# Patient Record
Sex: Male | Born: 1985 | Race: White | Hispanic: No | Marital: Married | Smoking: Current every day smoker
Health system: Southern US, Community
[De-identification: ages and names within clinical notes are randomized; demographics above are authoritative.]

## PROBLEM LIST (undated history)

## (undated) DIAGNOSIS — L409 Psoriasis, unspecified: Secondary | ICD-10-CM

## (undated) DIAGNOSIS — J3501 Chronic tonsillitis: Secondary | ICD-10-CM

## (undated) DIAGNOSIS — I10 Essential (primary) hypertension: Secondary | ICD-10-CM

## (undated) DIAGNOSIS — Z720 Tobacco use: Secondary | ICD-10-CM

## (undated) DIAGNOSIS — F17200 Nicotine dependence, unspecified, uncomplicated: Secondary | ICD-10-CM

## (undated) DIAGNOSIS — F1020 Alcohol dependence, uncomplicated: Secondary | ICD-10-CM

## (undated) HISTORY — DX: Psoriasis, unspecified: L40.9

## (undated) HISTORY — DX: Tobacco use: Z72.0

## (undated) HISTORY — DX: Nicotine dependence, unspecified, uncomplicated: F17.200

## (undated) HISTORY — DX: Essential (primary) hypertension: I10

## (undated) HISTORY — DX: Alcohol dependence, uncomplicated: F10.20

---
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
## 2007-02-10 HISTORY — PX: FRACTURE SURGERY: SHX138

## 2007-03-07 ENCOUNTER — Emergency Department (HOSPITAL_COMMUNITY): Admission: EM | Admit: 2007-03-07 | Discharge: 2007-03-08 | Payer: Self-pay | Admitting: Emergency Medicine

## 2007-08-16 ENCOUNTER — Emergency Department (HOSPITAL_COMMUNITY): Admission: EM | Admit: 2007-08-16 | Discharge: 2007-08-16 | Payer: Self-pay | Admitting: Emergency Medicine

## 2007-08-18 ENCOUNTER — Ambulatory Visit (HOSPITAL_COMMUNITY): Admission: RE | Admit: 2007-08-18 | Discharge: 2007-08-18 | Payer: Self-pay | Admitting: Orthopedic Surgery

## 2007-08-18 HISTORY — PX: ORIF FIBULA FRACTURE: SHX2121
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
## 2010-06-27 NOTE — Discharge Summary (Signed)
NAME:  MARTINO, TOMPSON                 ACCOUNT NO.:  [id number]   MEDICAL RECORD NO.:  [id number]           PATIENT TYPE:   LOCATION:                                 FACILITY:   PHYSICIAN:  Clovis Pu. Cornett, M.D.     DATE OF BIRTH:   DATE OF ADMISSION:  DATE OF DISCHARGE:                                 DISCHARGE SUMMARY   Mr. Chevalier is an 25 year old male patient who presented with right lower  quadrant pain for approximately one and a half days.  The pain has worsened  and accompanied by nausea and vomiting.  On examination he elicited focal  right lower quadrant pain, but no guarding or peritonitis.  CT scan showed  enlarged inflamed appendix with a slightly elevated white count at 12,200.   The patient was taken urgently to the operating room under the care of Dr.  Chevis Pretty.  He was diagnosed with appendicitis, an underwent a laparoscopic  appendectomy.  He tolerated the procedure well.  By the following morning he  was ready for discharge home.   DISCHARGE DIAGNOSES:  Acute appendicitis status post laparoscopic  appendectomy as noted above.   Patient has a return visit on January 06, 2005 8:45 a.m.  He was given a  prescription for Vicodin as needed for pain.  No driving for two days.  No  lifting for one week over 10 pounds.  He is to follow up with Dr. Carolynne Edouard again  on January 06, 2005 at 8:45 a.m.      Guy Franco, P.A.      Thomas A. Cornett, M.D.  Electronically Signed    LB/MEDQ  D:  02/03/2005  T:  02/04/2005  Job:  [id number]   cc:   Ollen Gross. Vernell Morgans, M.D.  [address]  [place]  [place] [address]

## 2010-06-27 NOTE — Op Note (Signed)
NAME:  Ralph Tapia, Ralph Tapia                 ACCOUNT NO.:  [id number]   MEDICAL RECORD NO.:  [id number]          PATIENT TYPE:  INP   LOCATION:  5741                         FACILITY:  [facility]   PHYSICIAN:  Ollen Gross. Vernell Morgans, M.D. DATE OF BIRTH:  04/03/1985   DATE OF PROCEDURE:  12/21/2004  DATE OF DISCHARGE:  12/22/2004                                 OPERATIVE REPORT   PREOPERATIVE DIAGNOSIS:  Appendicitis.   POSTOPERATIVE DIAGNOSIS:  Appendicitis.   OPERATION PERFORMED:  Laparoscopic appendectomy.   SURGEON:  Ollen Gross. Carolynne Edouard, M.D.   ANESTHESIA:  General endotracheal.   DESCRIPTION OF PROCEDURE:  After informed consent was obtained, the patient  was brought to the operating room and placed in supine position on the  operating table.  After adequate induction of general anesthesia, the  patient's abdomen was prepped with Betadine and draped in the usual sterile  manner.  The area below the umbilicus was infiltrated with 0.25% Marcaine  and a small incision was made with a 15 blade knife.  This incision was  carried down through the subcutaneous tissue bluntly with a hemostat and  [employer] retractors until the linea alba was identified.  The linea alba  was incised with a 15 blade knife and each side was grasped with Kocher  clamps and elevated anteriorly.  The preperitoneal space was then probed  bluntly with a hemostat until the preperitoneum was opened and access was  gained to the abdominal cavity.  A 0 Vicryl pursestring suture was placed  into the fascia around the opening.  A Hasson cannula was placed through the  opening and anchored in place with a previously placed Vicryl pursestring  stitch.  The abdomen was then insufflated with carbon dioxide without  difficulty.  The patient was placed in Trendelenburg position and rotated  with the right side up.  A laparoscope was inserted through the Hasson  cannula and the right lower quadrant was inspected.  An inflamed appendix  was  readily identified.  Next, the suprapubic area was infiltrated with  0.25% Marcaine.  A small incision was made with a 15 blade knife and a 10 mm  port was placed bluntly through this incision into the abdominal cavity  under direct vision.  A site was chosen above the umbilicus for placement of  a 5 mm port and this area was infiltrated with 0.25% Marcaine.  A small stab  incision was made with a 15 blade knife and a 5 mm port was placed bluntly  through this incision into the abdominal cavity under direct vision.  The  laparoscope was removed to the suprapubic port.  A Glassman retractor was  placed and Harmonic scalpel placed through the upper two ports. The appendix  was grasped with the Glassman grasper and elevated anteriorly.  The appendix  was then mobilized by dissection with the Harmonic scalpel.  The  mesoappendix was taken down sharply with the Harmonic scalpel. There was a  large appendiceal artery which was controlled with two vascular clips.  Once  the base of the appendix was cleared of any  tissue with the Harmonic  scalpel.  Laparoscopic GIA-35 stapler with a blue load was placed through  the Hasson cannula and across the base of the appendix at its junction with  the cecum, clamped, a minute was allowed to pass.  The stapling device was  then fired, thereby dividing the base of the appendix between staple lines.  A laparoscopic bag was then inserted through the Hasson cannula and the  appendix was placed within the bag and the bag was sealed.  The abdomen was  then irrigated with copious amounts of saline.  The staple line was  inspected and found to be completely intact and hemostatic. The appendix and  bag were then removed with the Hasson cannula through the infraumbilical  port without difficulty.  The fascial defect was closed with the previously  placed Vicryl pursestring stitch as well as with another figure-of-eight 0  Vicryl stitch.  The rest of the ports were  removed under direct vision. The  gas was allowed to escape.  The fascia at the suprapubic port was also  closed with an interrupted 0 Vicryl stitch.  Skin incisions were  all closed with interrupted 4-0 Monocryl subcuticular stitch, benzoin and  Steri-Strips were applied.  The patient tolerated the procedure well.  At  the end of the case, all sponge, needle and instrument counts were correct.  The patient was then awakened and taken to recovery room in stable  condition.      Ollen Gross. Vernell Morgans, M.D.  Electronically Signed     PST/MEDQ  D:  12/21/2004  T:  12/22/2004  Job:  [id number]

## 2010-06-27 NOTE — H&P (Signed)
NAME:  MOSES, ODOHERTY                 ACCOUNT NO.:  [id number]   MEDICAL RECORD NO.:  [id number]          PATIENT TYPE:  INP   LOCATION:  1825                         FACILITY:  [facility]   PHYSICIAN:  Ollen Gross. Vernell Morgans, M.D. DATE OF BIRTH:  10-Apr-1985   DATE OF ADMISSION:  12/21/2004  DATE OF DISCHARGE:                                HISTORY & PHYSICAL   Mr. Balaban is an 25 year old white male who presents with right lower  quadrant pain that has been going on for about the last day and a half. The  pain has worsened throughout the day today. He has had some nausea and  vomiting associated with this but no fevers. He is otherwise in good health.  The rest of his review of systems is unremarkable .   PAST MEDICAL HISTORY:  None.   PAST SURGICAL HISTORY:  Significant for wisdom teeth removal.   MEDICATIONS:  None.   ALLERGIES:  No known drug allergies.   SOCIAL HISTORY:  He smokes about a pack of cigarettes a day. Denies any  alcohol use.   FAMILY HISTORY:  Noncontributory.   PHYSICAL EXAMINATION:  VITAL SIGNS:  Temperature is 98.2, blood pressure  113/61, pulse of 58.  GENERAL:  He is a well-developed, well-nourished white male in no acute  distress.  SKIN:  Warm and dry with no jaundice.  HEENT:  His extraocular muscles are intact. Pupils are equal, round, and  reactive to light. Sclerae are nonicteric.  LUNGS:  Clear bilaterally with no use of accessory respiratory muscles.  HEART:  Regular rate and rhythm with an impulse in the left chest.  ABDOMEN:  Soft. Focal right lower quadrant tenderness but no guarding or  peritoneal signs. No palpable mass or hepatosplenomegaly.  EXTREMITIES:  No clubbing, cyanosis, or edema with good strength in his arms  and legs.  NEUROLOGICAL:  Psychologically, he is alert and oriented x3 with no evidence  of anxiety or depression.   On review of his lab work, it was significant for a white count of 12,200.  His CT scan was reviewed with the  radiologist and it showed some enlarged  inflamed appendix with an appendilith.   ASSESSMENT/PLAN:  This is an 25 year old white male with what appears to be  acute appendicitis. Because of the risk of rupture and sepsis, I think he  would benefit from having his appendix removed tonight. I have explained to  him detail the risks and benefits of the operation to remove the appendix as  well as some of the technical aspects. He understands and wish to proceed.  We will plan for this tonight in the operating room.      Ollen Gross. Vernell Morgans, M.D.  Electronically Signed     PST/MEDQ  D:  12/21/2004  T:  12/21/2004  Job:  [id number]

## 2010-10-30 LAB — I-STAT 8, (EC8 V) (CONVERTED LAB)
Acid-base deficit: 3 — ABNORMAL HIGH
BUN: 11
Bicarbonate: 17.5 — ABNORMAL LOW
Chloride: 114 — ABNORMAL HIGH
Glucose, Bld: 95
HCT: 56 — ABNORMAL HIGH
Hemoglobin: 19 — ABNORMAL HIGH
Operator id: 151321
Potassium: 3.6
Sodium: 141
TCO2: 18
pCO2, Ven: 23.9 — ABNORMAL LOW
pH, Ven: 7.473 — ABNORMAL HIGH

## 2010-10-30 LAB — POCT I-STAT CREATININE
Creatinine, Ser: 1
Operator id: 151321

## 2010-10-30 LAB — ETHANOL
Alcohol, Ethyl (B): 154 — ABNORMAL HIGH
Alcohol, Ethyl (B): 220 — ABNORMAL HIGH

## 2010-11-06 LAB — CBC
HCT: 44.2
Hemoglobin: 15.8
MCHC: 35.7
MCV: 98.3
Platelets: 252
RBC: 4.49
RDW: 12.7
WBC: 12.8 — ABNORMAL HIGH

## 2011-02-10 HISTORY — PX: TONSILLECTOMY: SHX5217

## 2011-08-10 DIAGNOSIS — J3501 Chronic tonsillitis: Secondary | ICD-10-CM

## 2011-08-10 HISTORY — DX: Chronic tonsillitis: J35.01

## 2011-08-11 ENCOUNTER — Encounter (HOSPITAL_BASED_OUTPATIENT_CLINIC_OR_DEPARTMENT_OTHER): Payer: Self-pay | Admitting: *Deleted

## 2011-08-17 NOTE — H&P (Signed)
PREOPERATIVE H&P  Chief Complaint: large tonsils  HPI: Ralph Tapia is a 26 y.o. male who presents for evaluation of large tonsils and recurrent sore throats. He 's taken antibiotics for sore throats in the past but continues to have large cryptic tonsils. He's taken to the OR at this time for tonsillectomy.  Past Medical History  Diagnosis Date  . Chronic tonsillitis 08/2011    snores during sleep, occ. stops breathing, and wakes up gasping/choking; has not had sleep study   Past Surgical History  Procedure Date  . Orif fibula fracture 08/18/2007    right distal fib.  . Laparoscopic appendectomy 12/21/2004   History   Social History  . Marital Status: Single    Spouse Name: N/A    Number of Children: N/A  . Years of Education: N/A   Social History Main Topics  . Smoking status: Current Everyday Smoker -- 0.5 packs/day for 3 years    Types: Cigarettes  . Smokeless tobacco: Never Used  . Alcohol Use: Yes     3-4 x/week  . Drug Use: No  . Sexually Active:    Other Topics Concern  . None   Social History Narrative  . None   History reviewed. No pertinent family history. No Known Allergies Prior to Admission medications   Not on File     Positive ROS: sore throat and snoring  All other systems have been reviewed and were otherwise negative with the exception of those mentioned in the HPI and as above.  Physical Exam: There were no vitals filed for this visit.  General: Alert, no acute distress Oral: Normal oral mucosa and 3+ bilateral tonsils Nasal: Clear nasal passages Neck: No palpable adenopathy or thyroid nodules Ear: Ear canal is clear with normal appearing TMs Cardiovascular: Regular rate and rhythm, no murmur.  Respiratory: Clear to auscultation Neurologic: Alert and oriented x 3   Assessment/Plan: chronic tonsillitis Plan for Procedure(s): TONSILLECTOMY   Dillard Cannon, MD 08/17/2011 6:24 PM

## 2011-08-18 ENCOUNTER — Encounter (HOSPITAL_BASED_OUTPATIENT_CLINIC_OR_DEPARTMENT_OTHER): Payer: Self-pay | Admitting: Anesthesiology

## 2011-08-18 ENCOUNTER — Encounter (HOSPITAL_BASED_OUTPATIENT_CLINIC_OR_DEPARTMENT_OTHER)
Admission: RE | Disposition: A | Discharge status: Home / Self Care | Payer: Self-pay | Source: Ambulatory Visit | Attending: Otolaryngology

## 2011-08-18 ENCOUNTER — Ambulatory Visit (HOSPITAL_BASED_OUTPATIENT_CLINIC_OR_DEPARTMENT_OTHER): Payer: BC Managed Care – PPO | Admitting: Anesthesiology

## 2011-08-18 ENCOUNTER — Encounter (HOSPITAL_BASED_OUTPATIENT_CLINIC_OR_DEPARTMENT_OTHER): Payer: Self-pay | Admitting: *Deleted

## 2011-08-18 ENCOUNTER — Ambulatory Visit (HOSPITAL_BASED_OUTPATIENT_CLINIC_OR_DEPARTMENT_OTHER)
Admission: RE | Admit: 2011-08-18 | Discharge: 2011-08-18 | Disposition: A | Discharge status: Home / Self Care | Payer: BC Managed Care – PPO | Source: Ambulatory Visit | Attending: Otolaryngology | Admitting: Otolaryngology

## 2011-08-18 DIAGNOSIS — J3501 Chronic tonsillitis: Secondary | ICD-10-CM | POA: Insufficient documentation

## 2011-08-18 HISTORY — DX: Chronic tonsillitis: J35.01

## 2011-08-18 HISTORY — PX: TONSILLECTOMY: SHX5217

## 2011-08-18 SURGERY — TONSILLECTOMY
Anesthesia: General | Site: Throat | Laterality: Bilateral | Wound class: Clean Contaminated

## 2011-08-18 MED ORDER — PROPOFOL 10 MG/ML IV EMUL
INTRAVENOUS | Status: DC | PRN
  Administered 2011-08-18: 200 mg via INTRAVENOUS

## 2011-08-18 MED ORDER — ACETAMINOPHEN 10 MG/ML IV SOLN
1000.0000 mg | Freq: Once | INTRAVENOUS | Status: AC
  Administered 2011-08-18: 1000 mg via INTRAVENOUS

## 2011-08-18 MED ORDER — HYDROCODONE-ACETAMINOPHEN 7.5-500 MG/15ML PO SOLN: 15.0000 mL | Freq: Four times a day (QID) | ORAL | Status: AC | PRN

## 2011-08-18 MED ORDER — MIDAZOLAM HCL 5 MG/5ML IJ SOLN
INTRAMUSCULAR | Status: DC | PRN
  Administered 2011-08-18: 2 mg via INTRAVENOUS

## 2011-08-18 MED ORDER — LACTATED RINGERS IV SOLN
INTRAVENOUS | Status: DC
  Administered 2011-08-18 (×2): via INTRAVENOUS

## 2011-08-18 MED ORDER — METOCLOPRAMIDE HCL 5 MG/ML IJ SOLN
INTRAMUSCULAR | Status: DC | PRN
  Administered 2011-08-18: 10 mg via INTRAVENOUS

## 2011-08-18 MED ORDER — SCOPOLAMINE 1 MG/3DAYS TD PT72
1.0000 | MEDICATED_PATCH | Freq: Once | TRANSDERMAL | Status: DC
  Administered 2011-08-18: 1.5 mg via TRANSDERMAL

## 2011-08-18 MED ORDER — METOCLOPRAMIDE HCL 5 MG/ML IJ SOLN: 10.0000 mg | Freq: Once | INTRAMUSCULAR | Status: DC | PRN

## 2011-08-18 MED ORDER — SUCCINYLCHOLINE CHLORIDE 20 MG/ML IJ SOLN
INTRAMUSCULAR | Status: DC | PRN
  Administered 2011-08-18: 100 mg via INTRAVENOUS

## 2011-08-18 MED ORDER — AMOXICILLIN 250 MG/5ML PO SUSR: 500.0000 mg | Freq: Two times a day (BID) | ORAL | Status: AC

## 2011-08-18 MED ORDER — ONDANSETRON HCL 4 MG/2ML IJ SOLN
INTRAMUSCULAR | Status: DC | PRN
  Administered 2011-08-18: 4 mg via INTRAVENOUS

## 2011-08-18 MED ORDER — FENTANYL CITRATE 0.05 MG/ML IJ SOLN
INTRAMUSCULAR | Status: DC | PRN
  Administered 2011-08-18 (×2): 50 ug via INTRAVENOUS

## 2011-08-18 MED ORDER — DEXAMETHASONE SODIUM PHOSPHATE 4 MG/ML IJ SOLN
INTRAMUSCULAR | Status: DC | PRN
  Administered 2011-08-18: 10 mg via INTRAVENOUS

## 2011-08-18 MED ORDER — LIDOCAINE HCL (CARDIAC) 20 MG/ML IV SOLN
INTRAVENOUS | Status: DC | PRN
  Administered 2011-08-18: 100 mg via INTRAVENOUS

## 2011-08-18 MED ORDER — HYDROMORPHONE HCL PF 1 MG/ML IJ SOLN
0.2500 mg | INTRAMUSCULAR | Status: DC | PRN
  Administered 2011-08-18: 0.25 mg via INTRAVENOUS
  Administered 2011-08-18: 0.5 mg via INTRAVENOUS
  Administered 2011-08-18: 0.25 mg via INTRAVENOUS

## 2011-08-18 MED ORDER — OXYCODONE HCL 5 MG PO TABS
5.0000 mg | ORAL_TABLET | Freq: Once | ORAL | Status: AC | PRN
  Administered 2011-08-18: 5 mg via ORAL

## 2011-08-18 MED ORDER — CEFAZOLIN SODIUM 1-5 GM-% IV SOLN
INTRAVENOUS | Status: DC | PRN
  Administered 2011-08-18: 1 g via INTRAVENOUS

## 2011-08-18 SURGICAL SUPPLY — 33 items
BANDAGE COBAN STERILE 2 (GAUZE/BANDAGES/DRESSINGS) IMPLANT
CANISTER SUCTION 1200CC (MISCELLANEOUS) ×2 IMPLANT
CATH ROBINSON RED A/P 12FR (CATHETERS) IMPLANT
CATH ROBINSON RED A/P 14FR (CATHETERS) IMPLANT
CLOTH BEACON ORANGE TIMEOUT ST (SAFETY) ×2 IMPLANT
COAGULATOR SUCT SWTCH 10FR 6 (ELECTROSURGICAL) IMPLANT
COVER MAYO STAND STRL (DRAPES) ×2 IMPLANT
ELECT COATED BLADE 2.86 ST (ELECTRODE) ×2 IMPLANT
ELECT REM PT RETURN 9FT ADLT (ELECTROSURGICAL) ×2
ELECT REM PT RETURN 9FT PED (ELECTROSURGICAL)
ELECTRODE REM PT RETRN 9FT PED (ELECTROSURGICAL) IMPLANT
ELECTRODE REM PT RTRN 9FT ADLT (ELECTROSURGICAL) IMPLANT
GAUZE SPONGE 4X4 12PLY STRL LF (GAUZE/BANDAGES/DRESSINGS) ×2 IMPLANT
GLOVE BIO SURGEON STRL SZ 6.5 (GLOVE) ×1 IMPLANT
GLOVE BIOGEL PI IND STRL 7.5 (GLOVE) IMPLANT
GLOVE BIOGEL PI INDICATOR 7.5 (GLOVE) ×1
GLOVE SKINSENSE NS SZ7.5 (GLOVE) ×1
GLOVE SKINSENSE STRL SZ7.5 (GLOVE) IMPLANT
GLOVE SS BIOGEL STRL SZ 7.5 (GLOVE) ×1 IMPLANT
GLOVE SUPERSENSE BIOGEL SZ 7.5 (GLOVE) ×1
GOWN PREVENTION PLUS XLARGE (GOWN DISPOSABLE) IMPLANT
GOWN PREVENTION PLUS XXLARGE (GOWN DISPOSABLE) IMPLANT
MARKER SKIN DUAL TIP RULER LAB (MISCELLANEOUS) IMPLANT
NS IRRIG 1000ML POUR BTL (IV SOLUTION) ×2 IMPLANT
PENCIL FOOT CONTROL (ELECTRODE) ×2 IMPLANT
SHEET MEDIUM DRAPE 40X70 STRL (DRAPES) ×2 IMPLANT
SOLUTION BUTLER CLEAR DIP (MISCELLANEOUS) IMPLANT
SPONGE TONSIL 1 RF SGL (DISPOSABLE) IMPLANT
SPONGE TONSIL 1.25 RF SGL STRG (GAUZE/BANDAGES/DRESSINGS) IMPLANT
SYR BULB 3OZ (MISCELLANEOUS) ×2 IMPLANT
TOWEL OR 17X24 6PK STRL BLUE (TOWEL DISPOSABLE) ×2 IMPLANT
TUBE CONNECTING 20X1/4 (TUBING) ×2 IMPLANT
WATER STERILE IRR 1000ML POUR (IV SOLUTION) ×1 IMPLANT

## 2011-08-18 NOTE — Anesthesia Procedure Notes (Signed)
Procedure Name: Intubation Date/Time: 08/18/2011 7:52 AM Performed by: Zenia Resides D Pre-anesthesia Checklist: Patient identified, Emergency Drugs available, Suction available, Patient being monitored and Timeout performed Patient Re-evaluated:Patient Re-evaluated prior to inductionOxygen Delivery Method: Circle System Utilized Preoxygenation: Pre-oxygenation with 100% oxygen Intubation Type: IV induction Ventilation: Mask ventilation without difficulty Laryngoscope Size: Mac and 3 Grade View: Grade I Tube type: Oral Number of attempts: 1 Airway Equipment and Method: stylet and oral airway Placement Confirmation: ETT inserted through vocal cords under direct vision,  positive ETCO2 and breath sounds checked- equal and bilateral Secured at: 22 cm Tube secured with: Tape Dental Injury: Teeth and Oropharynx as per pre-operative assessment

## 2011-08-18 NOTE — Anesthesia Preprocedure Evaluation (Signed)
Anesthesia Evaluation  Patient identified by MRN, date of birth, ID band Patient awake    Reviewed: Allergy & Precautions, H&P , NPO status , Patient's Chart, lab work & pertinent test results, reviewed documented beta blocker date and time   Airway Mallampati: II TM Distance: >3 FB Neck ROM: full    Dental   Pulmonary Current Smoker,          Cardiovascular negative cardio ROS      Neuro/Psych negative neurological ROS  negative psych ROS   GI/Hepatic negative GI ROS, Neg liver ROS,   Endo/Other  negative endocrine ROS  Renal/GU negative Renal ROS  negative genitourinary   Musculoskeletal   Abdominal   Peds  Hematology negative hematology ROS (+)   Anesthesia Other Findings See surgeon's H&P   Reproductive/Obstetrics negative OB ROS                           Anesthesia Physical Anesthesia Plan  ASA: II  Anesthesia Plan: General   Post-op Pain Management:    Induction: Intravenous  Airway Management Planned: Oral ETT  Additional Equipment:   Intra-op Plan:   Post-operative Plan: Extubation in OR  Informed Consent: I have reviewed the patients History and Physical, chart, labs and discussed the procedure including the risks, benefits and alternatives for the proposed anesthesia with the patient or authorized representative who has indicated his/her understanding and acceptance.   Dental Advisory Given  Plan Discussed with: CRNA and Surgeon  Anesthesia Plan Comments:         Anesthesia Quick Evaluation

## 2011-08-18 NOTE — Brief Op Note (Signed)
08/18/2011  8:10 AM  PATIENT:  Ralph Tapia  26 y.o. male  PRE-OPERATIVE DIAGNOSIS:  chronic tonsillitis  POST-OPERATIVE DIAGNOSIS:  chronic tonsillitis   PROCEDURE:  Procedure(s) (LRB): TONSILLECTOMY (Bilateral)  SURGEON:  Surgeon(s) and Role:    * Drema Halon, MD - Primary  PHYSICIAN ASSISTANT:   ASSISTANTS: none   ANESTHESIA:   general  EBL:  Total I/O In: 800 [I.V.:800] Out: -   BLOOD ADMINISTERED:none  DRAINS: none   LOCAL MEDICATIONS USED:  NONE  SPECIMEN:  No Specimen  DISPOSITION OF SPECIMEN:  N/A  COUNTS:  YES  TOURNIQUET:  * No tourniquets in log *  DICTATION: .Other Dictation: Dictation Number [id number]  PLAN OF CARE: Discharge to home after PACU  PATIENT DISPOSITION:  PACU - hemodynamically stable.   Delay start of Pharmacological VTE agent (>24hrs) due to surgical blood loss or risk of bleeding: not applicable

## 2011-08-18 NOTE — Transfer of Care (Signed)
Immediate Anesthesia Transfer of Care Note  Patient: Ralph Tapia  Procedure(s) Performed: Procedure(s) (LRB): TONSILLECTOMY (Bilateral)  Patient Location: PACU  Anesthesia Type: General  Level of Consciousness: awake, alert  and oriented  Airway & Oxygen Therapy: Patient Spontanous Breathing and Patient connected to face mask oxygen  Post-op Assessment: Report given to PACU RN  Post vital signs: Reviewed and stable  Complications: No apparent anesthesia complications

## 2011-08-18 NOTE — Op Note (Signed)
NAME:  Ralph Tapia, Ralph Tapia                 ACCOUNT NO.:  [id number]  MEDICAL RECORD NO.:  [id number]  LOCATION:                                 FACILITY:  PHYSICIAN:  Kristine Garbe. Ezzard Standing, M.D.DATE OF BIRTH:  Jun 02, 1985  DATE OF PROCEDURE:  08/18/2011 DATE OF DISCHARGE:                              OPERATIVE REPORT   PREOPERATIVE DIAGNOSIS:  Chronic tonsillitis with tonsillar hypertrophy.  POSTOPERATIVE DIAGNOSIS:  Chronic tonsillitis with tonsillar hypertrophy.  OPERATION PERFORMED:  Tonsillectomy.  SURGEON:  Kristine Garbe. Ezzard Standing, M.D.  ANESTHESIA:  General endotracheal.  COMPLICATIONS:  None.  BRIEF CLINICAL NOTE:  Ralph Tapia is a 26 year old gentleman who has had history of tonsillar infections in the past.  The patient had sore throats.  He has always had large tonsils.  DESCRIPTION OF PROCEDURE:  After adequate endotracheal anesthesia, the patient received 1 g Ancef IV preoperatively as well as 10 mg of Decadron.  A mouth gag was used to expose the oropharynx.  The tonsils, which were very large were excised from the tonsillar fossa using cautery.  Care was taken to preserve the tonsillar pillars as well as the uvula.  Hemostasis was obtained with cautery.  After obtaining adequate hemostasis, the oropharynx was irrigated with saline.  Ralph Tapia was awoken from anesthesia and transferred to recovery room postop doing well.  The patient was discharged home later this morning, will follow up in my office in 10 days for recheck.          ______________________________ Kristine Garbe. Ezzard Standing, M.D.     CEN/MEDQ  D:  08/18/2011  T:  08/18/2011  Job:  [id number]  cc:   Windle Guard, M.D.

## 2011-08-18 NOTE — Anesthesia Postprocedure Evaluation (Signed)
Anesthesia Post Note  Patient: Ralph Tapia  Procedure(s) Performed: Procedure(s) (LRB): TONSILLECTOMY (Bilateral)  Anesthesia type: General  Patient location: PACU  Post pain: Pain level controlled  Post assessment: Patient's Cardiovascular Status Stable  Last Vitals:  Filed Vitals:   08/18/11 0935  BP: 122/81  Pulse: 61  Temp: 36.3 C  Resp: 24    Post vital signs: Reviewed and stable  Level of consciousness: alert  Complications: No apparent anesthesia complications

## 2011-08-18 NOTE — Interval H&P Note (Signed)
History and Physical Interval Note:  08/18/2011 7:39 AM  Ralph Tapia  has presented today for surgery, with the diagnosis of chronic tonsillitis  The various methods of treatment have been discussed with the patient and family. After consideration of risks, benefits and other options for treatment, the patient has consented to  Procedure(s) (LRB): TONSILLECTOMY (N/A) as a surgical intervention .  The patient's history has been reviewed, patient examined, no change in status, stable for surgery.  I have reviewed the patients' chart and labs.  Questions were answered to the patient's satisfaction.     NEWMAN, CHRISTOPHER

## 2011-08-20 ENCOUNTER — Encounter (HOSPITAL_BASED_OUTPATIENT_CLINIC_OR_DEPARTMENT_OTHER): Payer: Self-pay | Admitting: Otolaryngology

## 2012-05-25 ENCOUNTER — Ambulatory Visit (INDEPENDENT_AMBULATORY_CARE_PROVIDER_SITE_OTHER): Payer: BC Managed Care – PPO | Admitting: Physician Assistant

## 2012-05-25 ENCOUNTER — Encounter: Payer: Self-pay | Admitting: Physician Assistant

## 2012-05-25 VITALS — BP 152/104 | HR 88 | Temp 98.4°F | Resp 18 | Ht 69.5 in | Wt 156.0 lb

## 2012-05-25 DIAGNOSIS — Z7189 Other specified counseling: Secondary | ICD-10-CM

## 2012-05-25 DIAGNOSIS — F17200 Nicotine dependence, unspecified, uncomplicated: Secondary | ICD-10-CM

## 2012-05-25 DIAGNOSIS — Z716 Tobacco abuse counseling: Secondary | ICD-10-CM

## 2012-05-25 MED ORDER — VARENICLINE TARTRATE 1 MG PO TABS: 1.0000 mg | ORAL_TABLET | Freq: Two times a day (BID) | ORAL | Status: DC

## 2012-05-25 MED ORDER — VARENICLINE TARTRATE 0.5 MG PO TABS: 0.5000 mg | ORAL_TABLET | Freq: Two times a day (BID) | ORAL | Status: DC

## 2012-05-25 NOTE — Progress Notes (Signed)
   Patient ID: Ralph Tapia MRN: [id number], DOB: May 24, 1985, 27 y.o. Date of Encounter: 05/25/2012, 5:19 PM    Chief Complaint:  Chief Complaint  Patient presents with  . skin lesion on leg    discuss stop smoking     HPI: 27 y.o. year old male here to discuss smoking cessation. He has been smoking for 10 years. Currently smokes >1 PPD. Has tried E-Cigarette, which did not help at all. Has used nothing else to try quitting.  Has no significant PMH. No h/o depression/psych hx.      Home Meds: No current outpatient prescriptions on file prior to visit.   No current facility-administered medications on file prior to visit.    Allergies: No Known Allergies    Review of Systems: Since he is a new pt he completed form. All ros negative. Constitutional: negative for chills, fever, night sweats, weight changes, or fatigue  HEENT: negative for vision changes, hearing loss, congestion, rhinorrhea, ST, epistaxis, or sinus pressure Cardiovascular: negative for chest pain or palpitations Respiratory: negative for hemoptysis, wheezing, shortness of breath, or cough Abdominal: negative for abdominal pain, nausea, vomiting, diarrhea, or constipation Dermatological: negative for rash Neurologic: negative for headache, dizziness, or syncope    Physical Exam: Blood pressure 132/84, pulse 88, temperature 98.4 F (36.9 C), temperature source Oral, resp. rate 18, height 5' 9.5" (1.765 m), weight 156 lb (70.761 kg)., Body mass index is 22.71 kg/(m^2). General: Well developed, well nourished,WM in no acute distress. Neck: Supple. No thyromegaly. Full ROM. No lymphadenopathy. Lungs: Clear bilaterally to auscultation without wheezes, rales, or rhonchi. Breathing is unlabored. Heart: RRR . No murmurs, rubs, or gallops. Msk:  Strength and tone normal for age. Extremities/Skin: Warm and dry. No clubbing or cyanosis. No edema. No rashes or suspicious lesions. Neuro: Alert and oriented X 3. Moves all  extremities spontaneously. Gait is normal. CNII-XII grossly in tact. Psych:  Responds to questions appropriately with a normal affect.   ASSESSMENT AND PLAN:  27 y.o. year old male with  1. Smoker 2. Encounter for smoking cessation counseling I gave him a handout with tips for cessation. Discussed options of treatments available to help with cessation. He is agreeable to use Chantix. Discussed possible adverse effects incuding, but not limited to, change in mood and behavior and even suicidal ideation. If any adv effects occur, stop the medication and call us immediately.  - varenicline (CHANTIX) 0.5 MG tablet; Take 1 tablet (0.5 mg total) by mouth 2 (two) times daily.  Dispense: 60 tablet; Refill: 0 - varenicline (CHANTIX CONTINUING MONTH PAK) 1 MG tablet; Take 1 tablet (1 mg total) by mouth 2 (two) times daily.  Dispense: 60 tablet; Refill: 2   Signed, [address] [place], [place], [facility] 05/25/2012 5:19 PM

## 2012-09-09 ENCOUNTER — Telehealth: Payer: Self-pay | Admitting: Physician Assistant

## 2012-09-09 DIAGNOSIS — Z716 Tobacco abuse counseling: Secondary | ICD-10-CM

## 2012-09-09 MED ORDER — VARENICLINE TARTRATE 1 MG PO TABS: 1.0000 mg | ORAL_TABLET | Freq: Two times a day (BID) | ORAL | Status: DC

## 2012-09-09 NOTE — Telephone Encounter (Signed)
One month refilled sent

## 2012-09-22 ENCOUNTER — Encounter: Payer: BC Managed Care – PPO | Admitting: Physician Assistant

## 2012-10-05 ENCOUNTER — Encounter: Payer: Self-pay | Admitting: Physician Assistant

## 2012-10-05 ENCOUNTER — Ambulatory Visit (INDEPENDENT_AMBULATORY_CARE_PROVIDER_SITE_OTHER): Payer: BC Managed Care – PPO | Admitting: Physician Assistant

## 2012-10-05 VITALS — BP 124/78 | HR 80 | Temp 98.2°F | Resp 18 | Ht 70.5 in | Wt 157.0 lb

## 2012-10-05 DIAGNOSIS — F17200 Nicotine dependence, unspecified, uncomplicated: Secondary | ICD-10-CM

## 2012-10-05 DIAGNOSIS — Z0000 Encounter for general adult medical examination without abnormal findings: Secondary | ICD-10-CM

## 2012-10-05 LAB — CBC WITH DIFFERENTIAL/PLATELET
Eosinophils Absolute: 0.1 10*3/uL (ref 0.0–0.7)
Hemoglobin: 15.2 g/dL (ref 13.0–17.0)
Lymphocytes Relative: 37 % (ref 12–46)
Lymphs Abs: 3 10*3/uL (ref 0.7–4.0)
MCH: 33.6 pg (ref 26.0–34.0)
Monocytes Relative: 6 % (ref 3–12)
Neutro Abs: 4.6 10*3/uL (ref 1.7–7.7)
Neutrophils Relative %: 56 % (ref 43–77)
Platelets: 294 10*3/uL (ref 150–400)
RBC: 4.52 MIL/uL (ref 4.22–5.81)
WBC: 8.2 10*3/uL (ref 4.0–10.5)

## 2012-10-05 LAB — COMPLETE METABOLIC PANEL WITH GFR
ALT: 14 U/L (ref 0–53)
Albumin: 4.9 g/dL (ref 3.5–5.2)
CO2: 26 mEq/L (ref 19–32)
Calcium: 9.8 mg/dL (ref 8.4–10.5)
Chloride: 103 mEq/L (ref 96–112)
GFR, Est African American: >89 mL/min
GFR, Est Non African American: >89 mL/min
Glucose, Bld: 88 mg/dL (ref 70–99)
Potassium: 4.6 mEq/L (ref 3.5–5.3)
Sodium: 138 mEq/L (ref 135–145)
Total Bilirubin: 0.9 mg/dL (ref 0.3–1.2)
Total Protein: 6.9 g/dL (ref 6.0–8.3)

## 2012-10-05 LAB — LIPID PANEL
HDL: 55 mg/dL (ref >39)
LDL Cholesterol: 103 mg/dL — ABNORMAL HIGH (ref 0–99)

## 2012-10-05 MED ORDER — BUPROPION HCL ER (SR) 150 MG PO TB12: ORAL_TABLET | ORAL | Status: DC

## 2012-10-05 NOTE — Progress Notes (Signed)
Patient ID: Ralph Tapia MRN: [id number], DOB: 12-20-1985 27 y.o. Date of Encounter: 10/05/2012, 10:37 AM    Chief Complaint: Physical (CPE)  HPI: 27 y.o. y/o male here for CPE.  I have seen him for one office visit prior to today's visit. This was on 05/25/2012. He came to discuss smoking cessation. At that visit I gave him a handout with tips for cessation. As well I gave him prescription for Chantix starting dose and continuing Dosepaks. He says that he got down to where he was smoking only one cigarette per day for about 2-3 weeks. However he then started feeling weird. He just felt that he had mood changes. Says wife was getting upset with him etc. he just didn't feel right he weaned himself off of the Chantix. He is back to smoking as much as in the past. Was over one pack a day. He says his wife does not smoke. He says that smoking at home is not the problem. He says smoking at work is the problem. Whenever he has times of high stress he goes outside and smokes. He says that he realizes this is a habit that really needs to be addressed. As well he says that lots of his coworkers smoke. This makes it difficult. However when he was on the Chantix in the past he was down to 1 cigarette per day.  Otherwise he has no complaints today. He is here to get complete physical exam and complete the form for his insurance.   Review of Systems: Consitutional: No fever, chills, fatigue, night sweats, lymphadenopathy, or weight changes. Eyes: No visual changes, eye redness, or discharge. ENT/Mouth: Ears: No otalgia, tinnitus, hearing loss, discharge. Nose: No congestion, rhinorrhea, sinus pain, or epistaxis. Throat: No sore throat, post nasal drip, or teeth pain. Cardiovascular: No CP, palpitations, diaphoresis, DOE, edema, orthopnea, PND. Respiratory: No cough, hemoptysis, SOB, or wheezing. Gastrointestinal: No anorexia, dysphagia, reflux, pain, nausea, vomiting, hematemesis, diarrhea, constipation,  BRBPR, or melena. Genitourinary: No dysuria, frequency, urgency, hematuria, incontinence, nocturia, decreased urinary stream, discharge, impotence, or testicular pain/masses. Musculoskeletal: No decreased ROM, myalgias, stiffness, joint swelling, or weakness. Skin: No rash, erythema, lesion changes, pain, warmth, jaundice, or pruritis. Neurological: No headache, dizziness, syncope, seizures, tremors, memory loss, coordination problems, or paresthesias. Psychological: No anxiety, depression, hallucinations, SI/HI. Endocrine: No fatigue, polydipsia, polyphagia, polyuria, or known diabetes. All other systems were reviewed and are otherwise negative.  Past Medical History  Diagnosis Date  . Chronic tonsillitis 08/2011    snores during sleep, occ. stops breathing, and wakes up gasping/choking; has not had sleep study  . Smoker      Past Surgical History  Procedure Laterality Date  . Orif fibula fracture  08/18/2007    right distal fib.  . Laparoscopic appendectomy  12/21/2004  . Tonsillectomy  08/18/2011    Procedure: TONSILLECTOMY;  Surgeon: Drema Halon, MD;  Location: [facility];  Service: ENT;  Laterality: Bilateral;  . Tonsillectomy  2013  . Fracture surgery Right 2009    orif    Home Meds: None No current outpatient prescriptions on file prior to visit.   No current facility-administered medications on file prior to visit.    Allergies: No Known Allergies  History   Social History  . Marital Status: married    Spouse Name: N/A    Number of Children: N/A  . Years of Education: N/A   Occupational History  . Works at [employer]    Social History Main Topics  .  Smoking status: Current Every Day Smoker -- 0.50 packs/day for 3 years    Types: Cigarettes  . Smokeless tobacco: Never Used  . Alcohol Use: Yes     Comment: 3-4 x/week  . Drug Use: No  . Sexual Activity: Yes   Other Topics Concern  . Not on file   Social History Narrative   Works as  Production designer, theatre/television/film at [employer]   Married.   No children   No exercise.    History reviewed. No pertinent family history.  Physical Exam: Blood pressure 124/78, pulse 80, temperature 98.2 F (36.8 C), temperature source Oral, resp. rate 18, height 5' 10.5" (1.791 m), weight 157 lb (71.215 kg).  General: Well developed, well nourished, white male.  in no acute distress. HEENT: Normocephalic, atraumatic. Conjunctiva pink, sclera non-icteric. Pupils 2 mm constricting to 1 mm, round, regular, and equally reactive to light and accomodation. EOMI. Internal auditory canal clear. TMs with good cone of light and without pathology. Nasal mucosa pink. Nares are without discharge. No sinus tenderness. Oral mucosa pink.  Pharynx without exudate.   Neck: Supple. Trachea midline. No thyromegaly. Full ROM. No lymphadenopathy. Lungs: Clear to auscultation bilaterally without wheezes, rales, or rhonchi. Breathing is of normal effort and unlabored. Cardiovascular: RRR with S1 S2. No murmurs, rubs, or gallops. Distal pulses 2+ symmetrically. No carotid or abdominal bruits. Abdomen: Soft, non-tender, non-distended with normoactive bowel sounds. No hepatosplenomegaly or masses. No rebound/guarding. No CVA tenderness. No hernias. Musculoskeletal: Full range of motion and 5/5 strength throughout. Without swelling, atrophy, tenderness, crepitus, or warmth. Extremities without clubbing, cyanosis, or edema. Calves supple. Skin: Warm and moist without erythema, ecchymosis, wounds, or rash. Neuro: A+Ox3. CN II-XII grossly intact. Moves all extremities spontaneously. Full sensation throughout. Normal gait. DTR 2+ throughout upper and lower extremities. Finger to nose intact. Psych:  Responds to questions appropriately with a normal affect.   Assessment/Plan:  27 y.o. y/o  male here for CPE -1. Visit for preventive health examination Normal exam. No significant family history. - CBC with Differential - COMPLETE METABOLIC PANEL  WITH GFR - Lipid panel  Immunizations: Immunization required at this age would be a tetanus shot. However he should have had this prior to his surgery he said that he has had recently. This should be up to date so we'll not do this today.  2. Smoker Will use Wellbutrin for smoking cessation. If he does develop adverse effects call me. - buPROPion (WELLBUTRIN SR) 150 MG 12 hr tablet; Take 1 daily for 5 days then increase to  One Twice a Day  Dispense: 60 tablet; Refill: 5   Signed:   [address] [place], [place]  10/05/2012 10:37 AM

## 2012-10-06 ENCOUNTER — Encounter: Payer: Self-pay | Admitting: Family Medicine

## 2013-04-06 ENCOUNTER — Ambulatory Visit: Payer: BC Managed Care – PPO | Admitting: Physician Assistant

## 2013-04-13 ENCOUNTER — Ambulatory Visit (INDEPENDENT_AMBULATORY_CARE_PROVIDER_SITE_OTHER): Payer: BC Managed Care – PPO | Admitting: Physician Assistant

## 2013-04-13 ENCOUNTER — Encounter: Payer: Self-pay | Admitting: Physician Assistant

## 2013-04-13 VITALS — BP 140/82 | HR 80 | Temp 98.2°F | Resp 18 | Wt 154.0 lb

## 2013-04-13 DIAGNOSIS — L408 Other psoriasis: Secondary | ICD-10-CM

## 2013-04-13 DIAGNOSIS — L409 Psoriasis, unspecified: Secondary | ICD-10-CM

## 2013-04-13 NOTE — Progress Notes (Signed)
    Patient ID: Ralph Tapia MRN: [id number], DOB: 1985-08-24, 28 y.o. Date of Encounter: 04/13/2013, 8:18 AM    Chief Complaint:  Chief Complaint  Patient presents with  . strange rash x sev mth    getting worse, itches ?psoriasis     HPI: 28 y.o. year old white male has started developing patches of rash on different parts of his body or the past several months. Says that the areas are extremely itchy. Says that he has been to this and thinks that it is psoriasis. Says he has no family history of psoriasis. Says he has no joint pain.     Home Meds: See attached medication section for any medications that were entered at today's visit. The computer does not put those onto this list.The following list is a list of meds entered prior to today's visit.   Current Outpatient Prescriptions on File Prior to Visit  Medication Sig Dispense Refill  . buPROPion (WELLBUTRIN SR) 150 MG 12 hr tablet Take 1 daily for 5 days then increase to  One Twice a Day  60 tablet  5   No current facility-administered medications on file prior to visit.    Allergies: No Known Allergies    Review of Systems: See HPI for pertinent ROS. All other ROS negative.    Physical Exam: Blood pressure 140/82, pulse 80, temperature 98.2 F (36.8 C), temperature source Oral, resp. rate 18, weight 154 lb (69.854 kg)., Body mass index is 21.78 kg/(m^2). General:  WNWD WM Appears in no acute distress. Lungs: Clear bilaterally to auscultation without wheezes, rales, or rhonchi. Breathing is unlabored. Heart: Regular rhythm. No murmurs, rubs, or gallops. Msk:  Strength and tone normal for age. Skin: Left anterior knee: There is an approximate 1/2 inch diameter area with pink erythema. On top of this is a thick layer of silvery scale. On his scalp on the right side just above the right ear there is an approximate 2 cm diameter area--this area is now covered in scab secondary to excoriation where patient has been  scratching with itching. There are some small lesions on his hand which he says it just started to pop up. They are very small but had a pink base of silvery scale on top.  Says he has similar lesions on his back and scattered on other areas of his body. Neuro: Alert and oriented X 3. Moves all extremities spontaneously. Gait is normal. CNII-XII grossly in tact. Psych:  Responds to questions appropriately with a normal affect.     ASSESSMENT AND PLAN:  28 y.o. year old male with  1. Psoriasis I agree that this does look like psoriasis. Will refer to dermatology for further evaluation and treatment. - Ambulatory referral to Dermatology   Signed, Shon HaleMary Beth Rohnert Park[place]ixon, [place], [facility] 04/13/2013 8:18 AM

## 2013-12-11 ENCOUNTER — Encounter: Payer: Self-pay | Admitting: Physician Assistant

## 2013-12-11 ENCOUNTER — Ambulatory Visit (INDEPENDENT_AMBULATORY_CARE_PROVIDER_SITE_OTHER): Payer: BC Managed Care – PPO | Admitting: Physician Assistant

## 2013-12-11 VITALS — BP 130/82 | HR 98 | Temp 98.3°F | Resp 20 | Ht 72.0 in | Wt 155.0 lb

## 2013-12-11 DIAGNOSIS — L409 Psoriasis, unspecified: Secondary | ICD-10-CM | POA: Diagnosis not present

## 2013-12-11 DIAGNOSIS — Z23 Encounter for immunization: Secondary | ICD-10-CM | POA: Diagnosis not present

## 2013-12-11 DIAGNOSIS — F17200 Nicotine dependence, unspecified, uncomplicated: Secondary | ICD-10-CM

## 2013-12-11 DIAGNOSIS — Z720 Tobacco use: Secondary | ICD-10-CM | POA: Diagnosis not present

## 2013-12-11 HISTORY — DX: Psoriasis, unspecified: L40.9

## 2013-12-11 NOTE — Progress Notes (Signed)
Patient ID: Ralph AxeBrian Tapia MRN: [id number], DOB: 22-Dec-1985, 28 y.o. Date of Encounter: 12/11/2013, 1:10 PM    Chief Complaint:  Chief Complaint  Patient presents with  . Psoriasis    2 yrs, need to be checked for Hep B thought he had as child, battling HTN x 2 yrs     HPI: 28 y.o. year old white male is here for follow-up.  He has had a prior visit with me at which time he was experiencing rash that seemed to be consistent with psoriasis and I did referral to dermatology. He states that he has had multiple visits with dermatology since then. States that they prescribed cream but the rash continued to get worse. Says that they now want to start Enbrel so they did some lab work. Says that the lab work shows that he is not immune to hepatitis B.  Therefore he comes in today to start the hepatitis B immunization series.  He also states that his wife has a blood pressure cuff at home and he has been checking his blood pressure some. He says it's been a while since he checked it and he cannot remember any actual numbers but he was concerned that the readings may have been a little high. Also says that his mother has high blood pressure so this is also why he is concerned that he may be having high blood pressure.  Also reviewed that he has had office visits with me in the past regarding smoking cessation. Says that he is currently smoking about 1 pack per day.  05/2012 office visit gave him a handout with tips for smoking cessation and also prescribed Chantix. With the Chantix he decreased smoking a lot and it almost quit but then he started developing changes in mood and behaviors that he had to stop the medication.  He subsequently restarted smoking.  10/05/2012 I prescribed Wellbutrin. Says that he quit for about 2 or 3 months with the Wellbutrin and he had no adverse effects with the Wellbutrin.  However today he is not wanting me to prescribe Wellbutrin again right now--- says that he  is not ready to quit right now.     Home Meds: None   Allergies: No Known Allergies    Review of Systems: See HPI for pertinent ROS. All other ROS negative.    Physical Exam: Blood pressure 130/82, pulse 98, temperature 98.3 F (36.8 C), temperature source Oral, resp. rate 20, height 6' (1.829 m), weight 155 lb (70.308 kg)., Body mass index is 21.02 kg/(m^2). General: WNWD WM.  Appears in no acute distress. Neck: Supple. No thyromegaly. No lymphadenopathy. Lungs: Clear bilaterally to auscultation without wheezes, rales, or rhonchi. Breathing is unlabored. Heart: Regular rhythm. No murmurs, rubs, or gallops. Msk:  Strength and tone normal for age. Extremities/Skin: Warm and dry. Neuro: Alert and oriented X 3. Moves all extremities spontaneously. Gait is normal. CNII-XII grossly in tact. Psych:  Responds to questions appropriately with a normal affect.     ASSESSMENT AND PLAN:  28 y.o. year old male with  1. Smoker See HPI. He does not want a prescription for medicine to help with cessation right now.  2. Psoriasis Dermatology wants to start Enbrel but he needs to have hepatitis B vaccine. We'll give him the first of the hepatitis B vaccines today and then he will follow-up to complete the series.  3. Need for prophylactic vaccination with combined vaccine - Hepatitis A hepatitis B combined vaccine IM  4.  Concerns of hypertension--I reviewed with him his blood pressure reading today and at prior office visits and the fact that they have been normal. I wrote down the number 140/90 and told him that if he gets readings higher than this then to follow-up. Recommended he thought check his blood pressure about once a month just to make sure that it is staying normal.  Signed, [address] Beth GlenfieldDixon, [place], [facility] 12/11/2013 1:10 PM

## 2014-01-25 ENCOUNTER — Ambulatory Visit (INDEPENDENT_AMBULATORY_CARE_PROVIDER_SITE_OTHER): Payer: BC Managed Care – PPO | Admitting: Family Medicine

## 2014-01-25 DIAGNOSIS — Z23 Encounter for immunization: Secondary | ICD-10-CM

## 2014-06-26 ENCOUNTER — Ambulatory Visit (INDEPENDENT_AMBULATORY_CARE_PROVIDER_SITE_OTHER): Payer: BLUE CROSS/BLUE SHIELD | Admitting: Family Medicine

## 2014-06-26 DIAGNOSIS — Z23 Encounter for immunization: Secondary | ICD-10-CM | POA: Diagnosis not present

## 2014-06-29 ENCOUNTER — Other Ambulatory Visit: Payer: Self-pay | Admitting: Physician Assistant

## 2015-01-21 ENCOUNTER — Encounter: Payer: Self-pay | Admitting: Physician Assistant

## 2015-01-21 ENCOUNTER — Ambulatory Visit (INDEPENDENT_AMBULATORY_CARE_PROVIDER_SITE_OTHER): Payer: BLUE CROSS/BLUE SHIELD | Admitting: Physician Assistant

## 2015-01-21 VITALS — BP 128/82 | HR 80 | Temp 98.5°F | Resp 18 | Wt 156.0 lb

## 2015-01-21 DIAGNOSIS — F1029 Alcohol dependence with unspecified alcohol-induced disorder: Secondary | ICD-10-CM | POA: Diagnosis not present

## 2015-01-21 DIAGNOSIS — F1020 Alcohol dependence, uncomplicated: Secondary | ICD-10-CM | POA: Insufficient documentation

## 2015-01-21 HISTORY — DX: Alcohol dependence, uncomplicated: F10.20

## 2015-01-21 MED ORDER — DIAZEPAM 5 MG PO TABS: 5.0000 mg | ORAL_TABLET | Freq: Two times a day (BID) | ORAL | Status: DC | PRN

## 2015-01-21 NOTE — Progress Notes (Addendum)
Patient ID: Ralph Tapia MRN: [id number], DOB: 03-24-1985, 29 y.o. Date of Encounter: 01/21/2015, 4:47 PM    Chief Complaint:  Chief Complaint  Patient presents with  . wants to discuss problem with alcohol     HPI: 29 y.o. year old white male is here to discuss the above.  Says that he has been drinking alcohol pretty regularly since about age 17. Currently says that on average drinks about 8-10 beers each evening. Drinks no additional type of alcohol as far as any types of liquor etc. Says that when his son was born he tried to quit. His son is now 68 months old. At time he was able to go with no alcohol for 2 days but then restarted. Says that he was not able to sleep and felt shaky. Says that he feels that part of his reason for drinking currently is that his job is very stressful. When he gets home from work, he drinks to unwind. Says that he is going to be off work December 25th through [place] and that this is when he plans to quit. Says that right now work is too stressful to try to quit and have to get up and go to work.  Says that he has looked into options and doesn't feel that he needs a 28 day program or go to the hospital for detox. Says that he has never tried Alcoholics Anonymous but doesn't think the group meetings like that would really be of much help. Says that his wife knows that he was coming here for this visit today and knows he wants to quit and that she is being very supportive and definitely wants him to quit as well.  Discussed whether he feels that he could just gradually decrease the amount of alcohol and limit himself to, for example 4 beers per night, for a while. He says that he does not think this would work for him. He feels that it's more of an "all or none" thing and that once he starts drinking, he will continue. Says that he needs to completely quit and get himself out of this habit.   Did ask about family history--- says that both of his  parents do drink alcohol-- his father more so than his mom.     Home Meds:   Outpatient Prescriptions Prior to Visit  Medication Sig Dispense Refill  . buPROPion (WELLBUTRIN SR) 150 MG 12 hr tablet Take 1 daily for 5 days then increase to  One Twice a Day (Patient not taking: Reported on 01/21/2015) 60 tablet 5   No facility-administered medications prior to visit.    Allergies: No Known Allergies    Review of Systems: See HPI for pertinent ROS. All other ROS negative.    Physical Exam: Blood pressure 128/82, pulse 80, temperature 98.5 F (36.9 C), temperature source Oral, resp. rate 18, weight 156 lb (70.761 kg)., Body mass index is 21.15 kg/(m^2). General: WNWD WM. Appears in no acute distress. Neck: Supple. No thyromegaly. No lymphadenopathy. Lungs: Clear bilaterally to auscultation without wheezes, rales, or rhonchi. Breathing is unlabored. Heart: Regular rhythm. No murmurs, rubs, or gallops. Msk:  Strength and tone normal for age. Extremities/Skin: Warm and dry. Neuro: Alert and oriented X 3. Moves all extremities spontaneously. Gait is normal. CNII-XII grossly in tact. Psych:  Responds to questions appropriately with a normal affect.     ASSESSMENT AND PLAN:  29 y.o. year old male with  1. Alcohol dependence with unspecified alcohol-induced  disorder [facility]) Will try outpatient detox. Will use diazepam to treat withdrawals. Discussed that he can NOT take med with alcohol. He must stop the alcohol, then take the diazepam 1 every 12 hours. He is to schedule f/u OV December 28th to f/u his progress. He is to f/u sooner--either here --- or the ER -- if needed. His wife is aware of this plan and will be present with him during that period of time.  - diazepam (VALIUM) 5 MG tablet; Take 1 tablet (5 mg total) by mouth every 12 (twelve) hours as needed for anxiety.  Dispense: 30 tablet; Refill: 0   Signed, [address] Beth PalmyraDixon, [place], [facility] 01/21/2015 4:47 PM

## 2015-02-07 ENCOUNTER — Ambulatory Visit (INDEPENDENT_AMBULATORY_CARE_PROVIDER_SITE_OTHER): Payer: BLUE CROSS/BLUE SHIELD | Admitting: Physician Assistant

## 2015-02-07 ENCOUNTER — Encounter: Payer: Self-pay | Admitting: Physician Assistant

## 2015-02-07 VITALS — BP 132/88 | HR 84 | Temp 98.2°F | Resp 18 | Wt 154.0 lb

## 2015-02-07 DIAGNOSIS — F1029 Alcohol dependence with unspecified alcohol-induced disorder: Secondary | ICD-10-CM | POA: Diagnosis not present

## 2015-02-07 DIAGNOSIS — Z23 Encounter for immunization: Secondary | ICD-10-CM | POA: Diagnosis not present

## 2015-02-07 MED ORDER — DIAZEPAM 5 MG PO TABS: 5.0000 mg | ORAL_TABLET | Freq: Three times a day (TID) | ORAL | Status: DC | PRN

## 2015-02-07 NOTE — Progress Notes (Signed)
Patient ID: Ralph Tapia MRN: [id number], DOB: 07-03-1985, 29 y.o. Date of Encounter: 02/07/2015, 11:06 AM    Chief Complaint:  Chief Complaint  Patient presents with  . 2 week followup  . Flu Vaccine     HPI: 29 y.o. year old white male is here for 2 week f/u OV.   01/21/2015 OV:  Says that he has been drinking alcohol pretty regularly since about age 29. Currently says that on average drinks about 8-10 beers each evening. Drinks no additional type of alcohol as far as any types of liquor etc. Says that when his son was born he tried to quit. His son is now 4920 months old. At time he was able to go with no alcohol for 2 days but then restarted. Says that he was not able to sleep and felt shaky. Says that he feels that part of his reason for drinking currently is that his job is very stressful. When he gets home from work, he drinks to unwind. Says that he [place] going to [place] off [place] and that this is when he plans to quit. Says that right now work is too stressful to try to quit and have to get up and go to work.  Says that he has looked into options and doesn't feel that he needs a 28 day program or go to the hospital for detox. Says that he has never tried Alcoholics Anonymous but doesn't think the group meetings like that would really be of much help. Says that his wife knows that he was coming here for this visit today and knows he wants to quit and that she is being very supportive and definitely wants him to quit as well.  Discussed whether he feels that he could just gradually decrease the amount of alcohol and limit himself to, for example 4 beers per night, for a while. He says that he does not think this would work for him. He feels that it's more of an "all or none" thing and that once he starts drinking, he will continue. Says that he needs to completely quit and get himself out of this habit.   Did ask about family history--- says that both  of his parents do drink alcohol-- his father more so than his mom.   ASSESSMENT AND PLAN:  29 y.o. year old male with  1. Alcohol dependence with unspecified alcohol-induced disorder (HCC) Will try outpatient detox. Will use diazepam to treat withdrawals. Discussed that he can NOT take med with alcohol. He must stop the alcohol, then take the diazepam 1 every 12 hours. He is to schedule f/u OV December 28th to f/u his progress. He is to f/u sooner--either here --- or the ER -- if needed. His wife is aware of this plan and will be present with him during that period of time.  - diazepam (VALIUM) 5 MG tablet; Take 1 tablet (5 mg total) by mouth every 12 (twelve) hours as needed for anxiety.  Dispense: 30 tablet; Refill: 0   TODAY---02/07/2015:  He states that he has had no alcohol since Sunday 02/03/15. He states that he has been taking the Valium one every morning and then 1 every evening at bedtime. He says that he has had no withdrawal symptoms. Says that he has gotten the best sleep that he has had in a long time. Says that in the past at around 2 AM, for whatever reason, he would wake up. He is  out of work this week through [employer]. Says the main thing right now is trying to keep himself busy so that he doesn't get bored and drink. We discussed that when he returns to work and is stressed at the end of the day-- making plans of what he is going to do when he gets home so that he does not go back to the habit of drinking alcohol when he gets home. He says that in the past his wife and son would go ahead and eat. Then patient would come home from work and he would drink and would wait and eat later. He says that since he's been out of work these past few days, they have been eating dinner together and they plan to continue to do this and he had said this will help create a new habit. He says that his wife is very supportive and is aware that he is doing this and will help him. He says that  actually his dad has decided to also trying to stop drinking alcohol.  Home Meds:   Outpatient Prescriptions Prior to Visit  Medication Sig Dispense Refill  . Adalimumab (HUMIRA [place]) Inject into the skin.    . diazepam (VALIUM) 5 MG tablet Take 1 tablet (5 mg total) by mouth every 12 (twelve) hours as needed for anxiety. 30 tablet 0  . buPROPion (WELLBUTRIN SR) 150 MG 12 hr tablet Take 1 daily for 5 days then increase to  One Twice a Day (Patient not taking: Reported on 01/21/2015) 60 tablet 5   No facility-administered medications prior to visit.    Allergies: No Known Allergies    Review of Systems: See HPI for pertinent ROS. All other ROS negative.    Physical Exam: Blood pressure 132/88, pulse 84, temperature 98.2 F (36.8 C), temperature source Oral, resp. rate 18, weight 154 lb (69.854 kg)., Body mass index is 20.88 kg/(m^2). General: WNWD WM. Appears in no acute distress. Neck: Supple. No thyromegaly. No lymphadenopathy. Lungs: Clear bilaterally to auscultation without wheezes, rales, or rhonchi. Breathing is unlabored. Heart: Regular rhythm. No murmurs, rubs, or gallops. Msk:  Strength and tone normal for age. Extremities/Skin: Warm and dry. Neuro: Alert and oriented X 3. Moves all extremities spontaneously. Gait is normal. CNII-XII grossly in tact. Psych:  Responds to questions appropriately with a normal affect.     ASSESSMENT AND PLAN:  29 y.o. year old male with   1. Alcohol dependence with unspecified alcohol-induced disorder (HCC) While he is out of work this week he will continue to take the Valium twice daily. In he returns to work after [employer] he will not take the Valium in the morning. He will take the Valium in the evening. Hopefully this will help him relax when he gets home from work and relieve his stress without him having to turn to alcohol for stress reduction. Also, as stated in the history of present illness, he is trying to create new habits to  do in the evening hours so that he can continue these new habits once he returns to work. Will have him schedule follow-up office visit in one month. Follow-up sooner if needed. At that follow-up visit will plan to discontinue the Valium. - diazepam (VALIUM) 5 MG tablet; Take 1 tablet (5 mg total) by mouth every 8 (eight) hours as needed for anxiety.  Dispense: 30 tablet; Refill: 0  Need for prophylactic vaccination and inoculation against influenza - Flu Vaccine QUAD 36+ mos IM  Signed, [address] [place], [place], [facility] 02/07/2015 11:06 AM

## 2015-02-20 ENCOUNTER — Other Ambulatory Visit: Payer: Self-pay | Admitting: Physician Assistant

## 2015-02-20 NOTE — Telephone Encounter (Signed)
Change Sig to say " 1 po QHS PRN" # 30 + 0

## 2015-02-20 NOTE — Telephone Encounter (Signed)
LRF 02/07/15 #30  LOV 02/07/15  OK refill?

## 2015-02-21 NOTE — Telephone Encounter (Signed)
Rx called in 

## 2015-03-11 ENCOUNTER — Encounter: Payer: Self-pay | Admitting: Physician Assistant

## 2015-03-11 ENCOUNTER — Ambulatory Visit (INDEPENDENT_AMBULATORY_CARE_PROVIDER_SITE_OTHER): Payer: BLUE CROSS/BLUE SHIELD | Admitting: Physician Assistant

## 2015-03-11 VITALS — BP 130/80 | HR 76 | Temp 98.0°F | Resp 18 | Wt 160.0 lb

## 2015-03-11 DIAGNOSIS — F411 Generalized anxiety disorder: Secondary | ICD-10-CM | POA: Diagnosis not present

## 2015-03-11 MED ORDER — DIAZEPAM 2 MG PO TABS: 2.0000 mg | ORAL_TABLET | Freq: Every evening | ORAL | Status: DC | PRN

## 2015-03-11 NOTE — Progress Notes (Signed)
Patient ID: Ralph Tapia MRN: [id number], DOB: 13-Jan-1986, 30 y.o. Date of Encounter: 03/11/2015, 4:08 PM    Chief Complaint:  Chief Complaint  Patient presents with  . 1 mth follow up     HPI: 30 y.o. year old white male is here for 1 month f/u OV.   01/21/2015 OV:  Says that he has been drinking alcohol pretty regularly since about age 68. Currently says that on average drinks about 8-10 beers each evening. Drinks no additional type of alcohol as far as any types of liquor etc. Says that when his son was born he tried to quit. His son is now 47 months old. At time he was able to go with no alcohol for 2 days but then restarted. Says that he was not able to sleep and felt shaky. Says that he feels that part of his reason for drinking currently is that his job is very stressful. When he gets home from work, he drinks to unwind. Says that he is going to be off work December 25th through [place] and that this is when he plans to quit. Says that right now work is too stressful to try to quit and have to get up and go to work.  Says that he has looked into options and doesn't feel that he needs a 28 day program or go to the hospital for detox. Says that he has never tried Alcoholics Anonymous but doesn't think the group meetings like that would really be of much help. Says that his wife knows that he was coming here for this visit today and knows he wants to quit and that she is being very supportive and definitely wants him to quit as well.  Discussed whether he feels that he could just gradually decrease the amount of alcohol and limit himself to, for example 4 beers per night, for a while. He says that he does not think this would work for him. He feels that it's more of an "all or none" thing and that once he starts drinking, he will continue. Says that he needs to completely quit and get himself out of this habit.   Did ask about family history--- says that both of his parents do  drink alcohol-- his father more so than his mom.   ASSESSMENT AND PLAN:  30 y.o. year old male with  1. Alcohol dependence with unspecified alcohol-induced disorder (HCC) Will try outpatient detox. Will use diazepam to treat withdrawals. Discussed that he can NOT take med with alcohol. He must stop the alcohol, then take the diazepam 1 every 12 hours. He is to schedule f/u OV December 28th to f/u his progress. He is to f/u sooner--either here --- or the ER -- if needed. His wife is aware of this plan and will be present with him during that period of time.  - diazepam (VALIUM) 5 MG tablet; Take 1 tablet (5 mg total) by mouth every 12 (twelve) hours as needed for anxiety.  Dispense: 30 tablet; Refill: 0   OV---02/07/2015:  He states that he has had no alcohol since Sunday 02/03/15. He states that he has been taking the Valium one every morning and then 1 every evening at bedtime. He says that he has had no withdrawal symptoms. Says that he has gotten the best sleep that he has had in a long time. Says that in the past at around 2 AM, for whatever reason, he would wake up. He is out of work  this week through [place]. Says the main thing right now is trying to keep himself busy so that he doesn't get bored and drink. We discussed that when he returns to work and is stressed at the end of the day-- making plans of what he is going to do when he gets home so that he does not go back to the habit of drinking alcohol when he gets home. He says that in the past his wife and son would go ahead and eat. Then patient would come home from work and he would drink and would wait and eat later. He says that since he's been out of work these past few days, they have been eating dinner together and they plan to continue to do this and he had said this will help create a new habit. He says that his wife is very supportive and is aware that he is doing this and will help him. He says that actually his dad has  decided to also trying to stop drinking alcohol.   ASSESSMENT AND PLAN:  30 y.o. year old male with   1. Alcohol dependence with unspecified alcohol-induced disorder (HCC) While he is out of work this week he will continue to take the Valium twice daily. In he returns to work after [employer] he will not take the Valium in the morning. He will take the Valium in the evening. Hopefully this will help him relax when he gets home from work and relieve his stress without him having to turn to alcohol for stress reduction. Also, as stated in the history of present illness, he is trying to create new habits to do in the evening hours so that he can continue these new habits once he returns to work. Will have him schedule follow-up office visit in one month. Follow-up sooner if needed. At that follow-up visit will plan to discontinue the Valium. - diazepam (VALIUM) 5 MG tablet; Take 1 tablet (5 mg total) by mouth every 8 (eight) hours as needed for anxiety.  Dispense: 30 tablet; Refill: 0   03/11/2015: Patient states that he has stayed off of the alcohol. Says it has been 36 days.  Says that his father also quit and has been off alcohol 36 days. Says he has been taking the Valium each night. Says that he and his wife and child have been able to continue the habit of eating dinner together when he gets home from work and has created new habit for him. Says so far, so good-- he is feeling good about it so far.    Home Meds:   Outpatient Prescriptions Prior to Visit  Medication Sig Dispense Refill  . Adalimumab (HUMIRA [place]) Inject into the skin.    . diazepam (VALIUM) 5 MG tablet Take 1 tablet (5 mg total) by mouth at bedtime as needed. for anxiety 30 tablet 0  . buPROPion (WELLBUTRIN SR) 150 MG 12 hr tablet Take 1 daily for 5 days then increase to  One Twice a Day (Patient not taking: Reported on 01/21/2015) 60 tablet 5   No facility-administered medications prior to visit.    Allergies: No  Known Allergies    Review of Systems: See HPI for pertinent ROS. All other ROS negative.    Physical Exam: Blood pressure 130/80, pulse 76, temperature 98 F (36.7 C), temperature source Oral, resp. rate 18, weight 160 lb (72.576 kg)., Body mass index is 21.7 kg/(m^2). General: WNWD WM. Appears in no acute distress. Neck: Supple.  No thyromegaly. No lymphadenopathy. Lungs: Clear bilaterally to auscultation without wheezes, rales, or rhonchi. Breathing is unlabored. Heart: Regular rhythm. No murmurs, rubs, or gallops. Msk:  Strength and tone normal for age. Extremities/Skin: Warm and dry. Neuro: Alert and oriented X 3. Moves all extremities spontaneously. Gait is normal. CNII-XII grossly in tact. Psych:  Responds to questions appropriately with a normal affect. Very appropriate affect, very appropriate behavior.      ASSESSMENT AND PLAN:  30 y.o. year old male with   Alcohol dependence with unspecified alcohol-induced disorder (HCC) Anxiety state Will decrease the dose of Valium down to 2 mg. Will have him take 2 mg dose each evening for several weeks then he can try going without it. Will not have him schedule follow-up office visit at this time. He can see how things go and follow-up PRN.  If he feels like he might slip back to consuming alcohol or feels that he needs Valium to help him relax rather than going back to alcohol, then he will schedule follow-up office visit. Otherwise, f/u PRN and Congrats on cessation of alcohol!! - diazepam (VALIUM) 2 MG tablet; Take 1 tablet (2 mg total) by mouth at bedtime as needed for anxiety.  Dispense: 30 tablet; Refill: 0  Signed, [address] [place], [place], [facility] 03/11/2015 4:08 PM

## 2015-04-22 ENCOUNTER — Other Ambulatory Visit: Payer: BLUE CROSS/BLUE SHIELD

## 2015-04-22 DIAGNOSIS — Z205 Contact with and (suspected) exposure to viral hepatitis: Secondary | ICD-10-CM

## 2015-04-22 LAB — HEPATIC FUNCTION PANEL
ALBUMIN: 4.3 g/dL (ref 3.6–5.1)
ALK PHOS: 48 U/L (ref 40–115)
ALT: 23 U/L (ref 9–46)
AST: 16 U/L (ref 10–40)
Bilirubin, Direct: 0.1 mg/dL (ref <=0.2)
Indirect Bilirubin: 0.5 mg/dL (ref 0.2–1.2)
TOTAL PROTEIN: 6.4 g/dL (ref 6.1–8.1)
Total Bilirubin: 0.6 mg/dL (ref 0.2–1.2)

## 2015-04-23 LAB — HEPATITIS PANEL, ACUTE
HCV Ab: NEGATIVE
HEP B S AG: NEGATIVE
Hep A IgM: NONREACTIVE
Hep B C IgM: NONREACTIVE

## 2015-09-15 ENCOUNTER — Other Ambulatory Visit: Payer: Self-pay | Admitting: Physician Assistant

## 2015-09-16 ENCOUNTER — Other Ambulatory Visit: Payer: Self-pay | Admitting: Physician Assistant

## 2015-09-20 DIAGNOSIS — D225 Melanocytic nevi of trunk: Secondary | ICD-10-CM | POA: Diagnosis not present

## 2015-09-20 DIAGNOSIS — L400 Psoriasis vulgaris: Secondary | ICD-10-CM | POA: Diagnosis not present

## 2015-10-02 DIAGNOSIS — Z79899 Other long term (current) drug therapy: Secondary | ICD-10-CM | POA: Diagnosis not present

## 2015-10-02 DIAGNOSIS — L409 Psoriasis, unspecified: Secondary | ICD-10-CM | POA: Diagnosis not present

## 2015-10-15 ENCOUNTER — Other Ambulatory Visit: Payer: Self-pay | Admitting: Physician Assistant

## 2015-10-15 NOTE — Telephone Encounter (Signed)
Ok to refill??  Last office visit 03/11/2015.  Last refill 09/16/2015.

## 2015-10-16 NOTE — Telephone Encounter (Signed)
Have him schedule a f/u OV.

## 2015-10-17 NOTE — Telephone Encounter (Signed)
Called  Pt to sch appt. Pt  req refill on Valium. Per provider appointment needs to be made

## 2016-03-24 DIAGNOSIS — L400 Psoriasis vulgaris: Secondary | ICD-10-CM | POA: Diagnosis not present

## 2016-09-22 DIAGNOSIS — Z79899 Other long term (current) drug therapy: Secondary | ICD-10-CM | POA: Diagnosis not present

## 2016-09-22 DIAGNOSIS — L400 Psoriasis vulgaris: Secondary | ICD-10-CM | POA: Diagnosis not present

## 2017-04-27 DIAGNOSIS — Z79899 Other long term (current) drug therapy: Secondary | ICD-10-CM | POA: Diagnosis not present

## 2017-04-27 DIAGNOSIS — L400 Psoriasis vulgaris: Secondary | ICD-10-CM | POA: Diagnosis not present

## 2017-06-16 ENCOUNTER — Other Ambulatory Visit: Payer: Self-pay

## 2017-06-16 ENCOUNTER — Encounter: Payer: Self-pay | Admitting: Physician Assistant

## 2017-06-16 ENCOUNTER — Ambulatory Visit (INDEPENDENT_AMBULATORY_CARE_PROVIDER_SITE_OTHER): Payer: BLUE CROSS/BLUE SHIELD | Admitting: Physician Assistant

## 2017-06-16 VITALS — BP 138/98 | HR 93 | Temp 98.0°F | Resp 18 | Ht 71.5 in | Wt 162.0 lb

## 2017-06-16 DIAGNOSIS — F1020 Alcohol dependence, uncomplicated: Secondary | ICD-10-CM | POA: Diagnosis not present

## 2017-06-16 DIAGNOSIS — F17200 Nicotine dependence, unspecified, uncomplicated: Secondary | ICD-10-CM

## 2017-06-16 DIAGNOSIS — Z23 Encounter for immunization: Secondary | ICD-10-CM

## 2017-06-16 DIAGNOSIS — Z0000 Encounter for general adult medical examination without abnormal findings: Secondary | ICD-10-CM | POA: Diagnosis not present

## 2017-06-16 DIAGNOSIS — L409 Psoriasis, unspecified: Secondary | ICD-10-CM

## 2017-06-16 DIAGNOSIS — I10 Essential (primary) hypertension: Secondary | ICD-10-CM

## 2017-06-16 HISTORY — DX: Essential (primary) hypertension: I10

## 2017-06-16 LAB — COMPLETE METABOLIC PANEL WITH GFR
AG RATIO: 1.8 (calc) (ref 1.0–2.5)
ALT: 19 U/L (ref 9–46)
AST: 21 U/L (ref 10–40)
Albumin: 4.6 g/dL (ref 3.6–5.1)
Alkaline phosphatase (APISO): 62 U/L (ref 40–115)
BUN: 16 mg/dL (ref 7–25)
CALCIUM: 9.7 mg/dL (ref 8.6–10.3)
CO2: 25 mmol/L (ref 20–32)
Chloride: 104 mmol/L (ref 98–110)
Creat: 0.96 mg/dL (ref 0.60–1.35)
GFR, EST AFRICAN AMERICAN: 122 mL/min/{1.73_m2} (ref >=60)
GFR, EST NON AFRICAN AMERICAN: 105 mL/min/{1.73_m2} (ref >=60)
GLUCOSE: 80 mg/dL (ref 65–99)
Globulin: 2.5 g/dL (calc) (ref 1.9–3.7)
POTASSIUM: 4.7 mmol/L (ref 3.5–5.3)
Sodium: 140 mmol/L (ref 135–146)
TOTAL PROTEIN: 7.1 g/dL (ref 6.1–8.1)
Total Bilirubin: 0.7 mg/dL (ref 0.2–1.2)

## 2017-06-16 LAB — CBC WITH DIFFERENTIAL/PLATELET
BASOS ABS: 29 {cells}/uL (ref 0–200)
Basophils Relative: 0.4 %
EOS ABS: 183 {cells}/uL (ref 15–500)
EOS PCT: 2.5 %
HEMATOCRIT: 45.1 % (ref 38.5–50.0)
HEMOGLOBIN: 16.2 g/dL (ref 13.2–17.1)
LYMPHS ABS: 2840 {cells}/uL (ref 850–3900)
MCH: 34.1 pg — AB (ref 27.0–33.0)
MCHC: 35.9 g/dL (ref 32.0–36.0)
MCV: 94.9 fL (ref 80.0–100.0)
MPV: 10.6 fL (ref 7.5–12.5)
Monocytes Relative: 8.1 %
NEUTROS ABS: 3657 {cells}/uL (ref 1500–7800)
Neutrophils Relative %: 50.1 %
Platelets: 272 10*3/uL (ref 140–400)
RBC: 4.75 10*6/uL (ref 4.20–5.80)
RDW: 12.6 % (ref 11.0–15.0)
Total Lymphocyte: 38.9 %
WBC mixed population: 591 cells/uL (ref 200–950)
WBC: 7.3 10*3/uL (ref 3.8–10.8)

## 2017-06-16 LAB — LIPID PANEL
Cholesterol: 201 mg/dL — ABNORMAL HIGH (ref <200)
HDL: 58 mg/dL (ref >40)
LDL CHOLESTEROL (CALC): 130 mg/dL — AB
NON-HDL CHOLESTEROL (CALC): 143 mg/dL — AB (ref <130)
TRIGLYCERIDES: 44 mg/dL (ref <150)
Total CHOL/HDL Ratio: 3.5 (calc) (ref <5.0)

## 2017-06-16 LAB — TSH: TSH: 0.5 mIU/L (ref 0.40–4.50)

## 2017-06-16 MED ORDER — AMLODIPINE BESYLATE 5 MG PO TABS: 5.0000 mg | ORAL_TABLET | Freq: Every day | ORAL | 11 refills | Status: DC

## 2017-06-16 NOTE — Progress Notes (Signed)
Patient ID: Ralph Tapia MRN: [id number], DOB: 01-12-1986 32 y.o. Date of Encounter: 06/16/2017, 9:07 AM    Chief Complaint: Physical (CPE)  HPI: 32 y.o. y/o male here for CPE.    Today I reviewed his prior visit notes from his prior visits with me.  He had visit with me 05/25/2012 to discuss smoking cessation.  At that visit I gave a handout with tips for cessation and also prescription for Chantix.  He had visit 10/05/2012 for CPE. At that visit he reported that he had taken the Chantix and he gotten to where he was smoking only 1 cigarette/day for about 2 to 3 weeks.   However he then started feeling weird and felt that he was having mood changes.   Weaned himself off of the Chantix.   At the time of that visit 09/2012 he was back to smoking as much as he had been in the past and was smoking over a pack a day.   Reported that his wife does not smoke.   Reported that a lot of coworkers smoked and he was smoking a lot at work.  At that visit 10/05/2012 I prescribed Wellbutrin to help with smoking cessation.  He also had visit with me regarding a rash that seem to be consistent with psoriasis and I had referred to dermatology.   He then had visit with me on 12/11/2013. At that visit he reported that he had had multiple visits with dermatology since then.  Reported that they had prescribed a cream but the rash continued to get worse.  At visit 12/11/2013 stated that they wanted to start Enbrel so they did some lab work.  Stated that the lab work showed that he was not immune to hepatitis B so came here to start hepatitis B unionization series 12/11/2013.  At his visit with me 12/11/2013 he also reported that his wife had a blood pressure cuff at home and have been checking his blood pressure some.  Says that those readings have been a little high.  At the time of the visit 12/11/2013 reported smoking about 1 pack/day. Reviewed that I prescribed the Chantix at 05/2012 visit.  That had caused  changes in mood and behaviors that he had stopped that medication.  Subsequently restarted smoking.  Reviewed that at visit 10/05/2012 I prescribed Wellbutrin.  At the follow-up visit 12/11/2013 he reported that he had quit smoking for about 2 or 3 months with the Wellbutrin and had no adverse effects with the Wellbutrin.  However at his visit 12/11/2013 stated that he was not wanting me to prescribe the Wellbutrin again.  Said that he was not ready to quit right now ".  At that visit to review that he was smoking but did not want any medication to help with cessation at that time. At that visit started hepatitis B vaccines so that he could start Enbrel with dermatology. At that visit his blood pressure reading was good and reviewed that his blood pressure readings had been good here at prior visits.   He was to continue monitoring BP and follow-up if getting greater than 140/90.  He then had visit with me 01/21/2015. Reported that he had been drinking alcohol regularly since he was about age 4.   At the time of his visit 01/2015 said that he had been drinking about 8-10 beers each evening.   Was drinking no additional type of alcohol as far as liquor etc.   At his visit 02/2015 he  was trying to stop his alcohol and was trying outpatient detox.  Had follow-up visit with me 02/07/2015.  See that note for detail.  That time he had decreased his alcohol consumption.  Subsequently had follow-up visit with me 03/11/2015.  At that point he has stayed off of alcohol for 36 days.   TODAY----06/16/2017: Today he is here for complete physical exam. He has no specific concerns to address--- except that his wife wants him to see about getting a booster for the MMR vaccine-- given the recent measles outbreak. Today he states that he still just has 1 child--- a son.  States that he turned 32 years old yesterday. I asked about his smoking and how much he is currently smoking.  Currently smoking about 1  pack/day. Also asked about his alcohol.  States that he is drinking some alcohol on a regular basis but "not nearly as much as it was in the past". Today I also noted that his blood pressure is a little high today.  I asked if he is wife or anyone has been checking his blood pressure at all recently.  He reports that he has not had his blood pressure checked at all in the past year. Also asked about his psoriasis.  States that he is on Humira and injects himself every 2 weeks. He is fasting to check labs today. We have no Tdap documented and he is not aware of having this in the past 10 years.  He is agreeable to update Tdap today as well as get an MMR booster. I rechecked his blood pressure myself and getting around 140/100, 138/98.  He is agreeable to go ahead and start Norvasc 5 mg daily and return for follow-up blood pressure check in 2 weeks. Reviewed family history again today and there is no premature family history of CAD or CA.     Review of Systems: Consitutional: No fever, chills, fatigue, night sweats, lymphadenopathy, or weight changes. Eyes: No visual changes, eye redness, or discharge. ENT/Mouth: Ears: No otalgia, tinnitus, hearing loss, discharge. Nose: No congestion, rhinorrhea, sinus pain, or epistaxis. Throat: No sore throat, post nasal drip, or teeth pain. Cardiovascular: No CP, palpitations, diaphoresis, DOE, edema, orthopnea, PND. Respiratory: No cough, hemoptysis, SOB, or wheezing. Gastrointestinal: No anorexia, dysphagia, reflux, pain, nausea, vomiting, hematemesis, diarrhea, constipation, BRBPR, or melena. Genitourinary: No dysuria, frequency, urgency, hematuria, incontinence, nocturia, decreased urinary stream, discharge, impotence, or testicular pain/masses. Musculoskeletal: No decreased ROM, myalgias, stiffness, joint swelling, or weakness. Skin: He has psoriasis.  Managed by dermatology. Neurological: No headache, dizziness, syncope, seizures, tremors, memory loss,  coordination problems, or paresthesias. Psychological: No anxiety, depression, hallucinations, SI/HI. Endocrine: No fatigue, polydipsia, polyphagia, polyuria, or known diabetes. All other systems were reviewed and are otherwise negative.  Past Medical History:  Diagnosis Date  . Chronic tonsillitis 08/2011   snores during sleep, occ. stops breathing, and wakes up gasping/choking; has not had sleep study  . Smoker      Past Surgical History:  Procedure Laterality Date  . FRACTURE SURGERY Right 2009   orif  . LAPAROSCOPIC APPENDECTOMY  12/21/2004  . ORIF FIBULA FRACTURE  08/18/2007   right distal fib.  . TONSILLECTOMY  08/18/2011   Procedure: TONSILLECTOMY;  Surgeon: Rozetta Nunnery, MD;  Location: [facility];  Service: ENT;  Laterality: Bilateral;  . TONSILLECTOMY  2013    Home Meds:  Outpatient Medications Prior to Visit  Medication Sig Dispense Refill  . Adalimumab (HUMIRA [place]) Inject into the skin.    Marland Kitchen  buPROPion (WELLBUTRIN SR) 150 MG 12 hr tablet Take 1 daily for 5 days then increase to  One Twice a Day 60 tablet 5  . diazepam (VALIUM) 2 MG tablet Take 1 tablet (2 mg total) by mouth at bedtime as needed for anxiety. 30 tablet 0  . diazepam (VALIUM) 5 MG tablet TAKE 1 TABLET BY MOUTH AT BEDTIME AS NEEDED 30 tablet 0   No facility-administered medications prior to visit.     Allergies: No Known Allergies  Social History   Socioeconomic History  . Marital status: Single    Spouse name: Not on file  . Number of children: Not on file  . Years of education: Not on file  . Highest education level: Not on file  Occupational History  . Occupation: Works at [employer]  . Financial resource strain: Not on file  . Food insecurity:    Worry: Not on file    Inability: Not on file  . Transportation needs:    Medical: Not on file    Non-medical: Not on file  Tobacco Use  . Smoking status: Current Every Day Smoker     Packs/day: 0.50    Years: 3.00    Pack years: 1.50    Types: Cigarettes  . Smokeless tobacco: Never Used  Substance and Sexual Activity  . Alcohol use: Yes    Comment: 3-4 x/week  . Drug use: No  . Sexual activity: Yes  Lifestyle  . Physical activity:    Days per week: Not on file    Minutes per session: Not on file  . Stress: Not on file  Relationships  . Social connections:    Talks on phone: Not on file    Gets together: Not on file    Attends religious service: Not on file    Active member of club or organization: Not on file    Attends meetings of clubs or organizations: Not on file    Relationship status: Not on file  . Intimate partner violence:    Fear of current or ex partner: Not on file    Emotionally abused: Not on file    Physically abused: Not on file    Forced sexual activity: Not on file  Other Topics Concern  . Not on file  Social History Narrative   Works as Freight forwarder at [employer]   Married.   No children   No exercise.    History reviewed. No pertinent family history.   Physical Exam: Blood pressure (!) 138/98, pulse 93, temperature 98 F (36.7 C), temperature source Oral, resp. rate 18, height 5' 11.5" (1.816 m), weight 73.5 kg (162 lb), SpO2 97 %., Body mass index is 22.28 kg/m. General: WNWD WM. Appears in no acute distress. Head: Normocephalic, atraumatic, eyes without discharge, sclera non-icteric, nares are without discharge. Bilateral auditory canals clear, TM's are without perforation, pearly grey and translucent with reflective cone of light bilaterally. Oral cavity moist, posterior pharynx without exudate, erythema, peritonsillar abscess.  Neck: Supple. No thyromegaly. No lymphadenopathy. Lungs: Clear bilaterally to auscultation without wheezes, rales, or rhonchi. Breathing is unlabored. Heart: RRR with S1 S2. No murmurs, rubs, or gallops. Abdomen: Soft, non-tender, non-distended with normoactive bowel sounds. No hepatomegaly. No  rebound/guarding. No obvious abdominal masses. Musculoskeletal:  Strength and tone normal for age. Extremities/Skin: Warm and dry. No clubbing or cyanosis. No edema. Neuro: Alert and oriented X 3. Moves all extremities spontaneously. Gait is normal. CNII-XII  grossly in tact. Psych:  Responds to questions appropriately with a normal affect.   Assessment/Plan:  32 y.o. y/o  male here for CPE  -1. Encounter for preventive health examination A. 06/16/2017: Screening Labs: - CBC with Differential/Platelet - COMPLETE METABOLIC PANEL WITH GFR - Lipid panel - TSH   B. Screening For Prostate Cancer: He has no indication to require this until age 39  C. Screening For Colorectal Cancer:  He has no indication to require this until age 65  D. Immunizations: Flu----------------------------N/A Tetanus---------- We have no tetanus vaccine documented in his chart.  He reports he does not think he has had this in over 10 years.  Agreeable to update this today.Tdap given here 06/16/2017 Pneumococcal--- he is a smoker so will need to get Pneumovax 23--- give this at next visit Shingrix-------------- not indicated until age 75.    2. Smoker 06/16/2017: See HPI.  I have prescribed Chantix in the past.  At another time later also prescribed Wellbutrin in the past.  Since then at any at all follow-ups since then he has not wanted any medication to help with cessation and "is not ready to quit ".  3. Uncomplicated alcohol dependence ([place]) 06/16/2017: See HPI for detail.  Has a history of significant alcohol use.  Today reports that he is drinking some alcohol but what much smaller amount.  Will check LFTs. - COMPLETE METABOLIC PANEL WITH GFR  4. Psoriasis 06/16/2017: This is managed by dermatology.  5. Essential hypertension 06/16/2017: His blood pressure is elevated today.  Will have him start Norvasc 5 mg daily and return for follow-up visit in 2 weeks to recheck BP on medication. ------------- also noted  that he had prior visit with me regarding his wife getting high blood pressure readings at home.  Therefore given his history will go ahead and start medication today rather than waiting to --------------monitor further. - amLODipine (NORVASC) 5 MG tablet; Take 1 tablet (5 mg total) by mouth daily.  Dispense: 30 tablet; Refill: 11    Signed:   [address] [place], [place]  06/16/2017 9:07 AM

## 2017-06-20 ENCOUNTER — Encounter (HOSPITAL_COMMUNITY): Payer: Self-pay | Admitting: Emergency Medicine

## 2017-06-20 ENCOUNTER — Emergency Department (HOSPITAL_COMMUNITY)
Admission: EM | Admit: 2017-06-20 | Discharge: 2017-06-20 | Disposition: A | Discharge status: Home / Self Care | Payer: BLUE CROSS/BLUE SHIELD | Attending: Emergency Medicine | Admitting: Emergency Medicine

## 2017-06-20 ENCOUNTER — Emergency Department (HOSPITAL_COMMUNITY): Payer: BLUE CROSS/BLUE SHIELD

## 2017-06-20 DIAGNOSIS — S20301A Unspecified superficial injuries of right front wall of thorax, initial encounter: Secondary | ICD-10-CM | POA: Diagnosis not present

## 2017-06-20 DIAGNOSIS — F17210 Nicotine dependence, cigarettes, uncomplicated: Secondary | ICD-10-CM | POA: Insufficient documentation

## 2017-06-20 DIAGNOSIS — S2231XA Fracture of one rib, right side, initial encounter for closed fracture: Secondary | ICD-10-CM | POA: Insufficient documentation

## 2017-06-20 DIAGNOSIS — R079 Chest pain, unspecified: Secondary | ICD-10-CM | POA: Diagnosis not present

## 2017-06-20 DIAGNOSIS — Y999 Unspecified external cause status: Secondary | ICD-10-CM | POA: Insufficient documentation

## 2017-06-20 DIAGNOSIS — S299XXA Unspecified injury of thorax, initial encounter: Secondary | ICD-10-CM | POA: Diagnosis not present

## 2017-06-20 DIAGNOSIS — Y929 Unspecified place or not applicable: Secondary | ICD-10-CM | POA: Diagnosis not present

## 2017-06-20 DIAGNOSIS — W208XXA Other cause of strike by thrown, projected or falling object, initial encounter: Secondary | ICD-10-CM | POA: Insufficient documentation

## 2017-06-20 DIAGNOSIS — I10 Essential (primary) hypertension: Secondary | ICD-10-CM | POA: Diagnosis not present

## 2017-06-20 DIAGNOSIS — Y939 Activity, unspecified: Secondary | ICD-10-CM | POA: Diagnosis not present

## 2017-06-20 MED ORDER — HYDROCODONE-ACETAMINOPHEN 5-325 MG PO TABS
1.0000 | ORAL_TABLET | Freq: Once | ORAL | Status: AC
  Administered 2017-06-20: 1 via ORAL
  Filled 2017-06-20: qty 1

## 2017-06-20 MED ORDER — HYDROCODONE-ACETAMINOPHEN 5-325 MG PO TABS: 1.0000 | ORAL_TABLET | Freq: Four times a day (QID) | ORAL | 0 refills | Status: DC | PRN

## 2017-06-20 NOTE — ED Triage Notes (Signed)
Patient to ED c/o R ribcage pain x 2 days - states he was carrying a deer stand weighing about 200lb. and it fell over on him. Patient reports tenderness to lateral, lower R ribcage - worse with movement and deep inspiration. Patient denies SOB, no CP. No obvious deformity or crepitus noted.

## 2017-06-20 NOTE — ED Provider Notes (Signed)
[facility] EMERGENCY DEPARTMENT Provider Note   CSN: [id number] Arrival date & time: 06/20/17  0825     History   Chief Complaint Chief Complaint  Patient presents with  . Rib Pain    HPI Ralph Tapia is a 32 y.o. male past medical history of tonsillitis who presents for evaluation of right-sided rib pain that began 2 days ago.  Patient reports that pain occurred after a deer stand fell on his lateral chest wall.  Patient reports that since then, he has had pain to the right lateral lower rib cage.  He states that the pain is worse when he moves around or with deep inspiration.  He denies any difficulty breathing otherwise.  Patient reports that he has not had any chest pain otherwise.  Patient denies any recent travel, leg swelling, immobilizations.  Patient denies any fevers, cough.   The history is provided by the patient.    Past Medical History:  Diagnosis Date  . Chronic tonsillitis 08/2011   snores during sleep, occ. stops breathing, and wakes up gasping/choking; has not had sleep study  . Smoker     Patient Active Problem List   Diagnosis Date Noted  . Essential hypertension 06/16/2017  . Alcohol dependence ([facility]) 01/21/2015  . Psoriasis 12/11/2013  . Smoker     Past Surgical History:  Procedure Laterality Date  . FRACTURE SURGERY Right 2009   orif  . LAPAROSCOPIC APPENDECTOMY  12/21/2004  . ORIF FIBULA FRACTURE  08/18/2007   right distal fib.  . TONSILLECTOMY  08/18/2011   Procedure: TONSILLECTOMY;  Surgeon: Drema Halon, MD;  Location: [facility];  Service: ENT;  Laterality: Bilateral;  . TONSILLECTOMY  2013        Home Medications    Prior to Admission medications   Medication Sig Start Date End Date Taking? Authorizing Provider  Adalimumab (HUMIRA [place]) Inject into the skin.    [provider]  amLODipine (NORVASC) 5 MG tablet Take 1 tablet (5 mg total) by mouth daily. 06/16/17 06/16/18  Dorena Bodo, PA-C    HYDROcodone-acetaminophen (NORCO/VICODIN) 5-325 MG tablet Take 1-2 tablets by mouth every 6 (six) hours as needed. 06/20/17   Maxwell Caul, PA-C    Family History No family history on file.  Social History Social History   Tobacco Use  . Smoking status: Current Every Day Smoker    Packs/day: 0.50    Years: 3.00    Pack years: 1.50    Types: Cigarettes  . Smokeless tobacco: Never Used  Substance Use Topics  . Alcohol use: Yes    Comment: 3-4 x/week  . Drug use: No     Allergies   Patient has no known allergies.   Review of Systems Review of Systems  Constitutional: Negative for fever.  Respiratory: Negative for cough and shortness of breath.   Cardiovascular: Negative for chest pain and leg swelling.  Musculoskeletal:       Right lateral chest wall pain     Physical Exam Updated Vital Signs BP (!) 145/98 (BP Location: Right Arm)   Pulse 88   Temp 98 F (36.7 C) (Oral)   Resp 16   SpO2 98%   Physical Exam  Constitutional: He appears well-developed and well-nourished.  HENT:  Head: Normocephalic and atraumatic.  Eyes: Conjunctivae and EOM are normal. Right eye exhibits no discharge. Left eye exhibits no discharge. No scleral icterus.  Pulmonary/Chest: Effort normal and breath sounds normal. He has no decreased  breath sounds.      Lungs clear to auscultation bilaterally.  Symmetric chest rise.  No wheezing, rales, rhonchi.  No evidence of decreased breath sounds.  Tenderness palpation overlying the right lateral chest wall approximately the eighth, ninth, 10th ribs.  No deformity or crepitus noted.  No surrounding ecchymosis.     Neurological: He is alert.  Skin: Skin is warm and dry.  Psychiatric: He has a normal mood and affect. His speech is normal and behavior is normal.  Nursing note and vitals reviewed.    ED Treatments / Results  Labs (all labs ordered are listed, but only abnormal results are displayed) Labs Reviewed - No data to  display  EKG None  Radiology Dg Chest 2 View  Result Date: 06/20/2017 CLINICAL DATA:  Right chest trauma 2 days ago.  Right chest pain. EXAM: CHEST - 2 VIEW COMPARISON:  03/07/2007 FINDINGS: Narrow AP diameter of the chest. Allowing for that, heart size and shape is normal. Mediastinal shadows are normal. Lungs are clear. No pneumothorax or hemothorax. No displaced rib fracture. Question nondisplaced fracture of the anterior right ninth rib. No spinal abnormality. IMPRESSION: No active cardiopulmonary disease. Question nondisplaced fracture of the anterior right ninth rib. Electronically Signed   By: Paulina Fusi M.D.   On: 06/20/2017 09:24    Procedures Procedures (including critical care time)  Medications Ordered in ED Medications  HYDROcodone-acetaminophen (NORCO/VICODIN) 5-325 MG per tablet 1 tablet (1 tablet Oral Given 06/20/17 0953)     Initial Impression / Assessment and Plan / ED Course  I have reviewed the triage vital signs and the nursing notes.  Pertinent labs & imaging results that were available during my care of the patient were reviewed by me and considered in my medical decision making (see chart for details).     32 year old male who presents for evaluation of right lateral chest wall pain x2 days that began after a deer stand fell and hit his chest wall.  Reports pain is worse with deep inspiration with movement.  No difficulty breathing, chest pain. Patient is afebrile, non-toxic appearing, sitting comfortably on examination table. Vital signs reviewed and stable.  On exam, patient with good air movement and no evidence of decreased breath sounds.  Patient with tenderness palpation noted to the right lateral chest wall approximately eighth, ninth, 10th region.  No deformity or crepitus noted.  Consider contusion versus sprain versus fracture.  X-rays ordered at triage.  X-rays reviewed.  No evidence of pneumothorax or hemothorax.  There is a question nondisplaced  fracture of the anterior right ninth rib which correlates with patient's pain.  Discussed results with patient and wife.  Discussed reports of pain control and incentive spirometry with patient.  Instructed patient follow-up with his primary care doctor the next 2 to 4 days. Patient had ample opportunity for questions and discussion. All patient's questions were answered with full understanding. Strict return precautions discussed. Patient expresses understanding and agreement to plan.   Final Clinical Impressions(s) / ED Diagnoses   Final diagnoses:  Closed fracture of one rib of right side, initial encounter    ED Discharge Orders        Ordered    HYDROcodone-acetaminophen (NORCO/VICODIN) 5-325 MG tablet  Every 6 hours PRN     06/20/17 0941       Maxwell Caul, PA-C 06/20/17 1007    Jacalyn Lefevre, MD 06/20/17 1014

## 2017-06-20 NOTE — Discharge Instructions (Addendum)
You can take 1000 mg of Tylenol.  Do not exceed 4000 mg of Tylenol a day.  You can take the pain medication for severe breakthrough pain.  Use the incentive spirometer as directed to prevent any pneumonia.  Follow-up with your primary care doctor needed.  Return emergency department for any worsening pain, fever, difficulty breathing or any other worsening or concerning symptoms.

## 2017-06-30 ENCOUNTER — Ambulatory Visit (INDEPENDENT_AMBULATORY_CARE_PROVIDER_SITE_OTHER): Payer: BLUE CROSS/BLUE SHIELD | Admitting: Physician Assistant

## 2017-06-30 ENCOUNTER — Encounter: Payer: Self-pay | Admitting: Physician Assistant

## 2017-06-30 VITALS — BP 134/86 | HR 80 | Temp 98.1°F | Resp 16 | Ht 71.5 in | Wt 164.2 lb

## 2017-06-30 DIAGNOSIS — I10 Essential (primary) hypertension: Secondary | ICD-10-CM

## 2017-06-30 MED ORDER — AMLODIPINE BESYLATE 10 MG PO TABS: 10.0000 mg | ORAL_TABLET | Freq: Every day | ORAL | 0 refills | Status: DC

## 2017-06-30 NOTE — Progress Notes (Signed)
    Patient ID: Ralph Tapia MRN: [id number], DOB: [birth year], 32 y.o. Date of Encounter: 06/30/2017, 8:14 AM    Chief Complaint:  Chief Complaint  Patient presents with  . dicuss blood pressure medicine     HPI: 32 y.o. year old male here to f/u HTN.  He was here for CPE with me on 06/16/2017.  At that visit blood pressure was slightly elevated.  I reviewed that in the past he reported that his wife had checked his blood pressure at home and had gotten high readings there.  Therefore, at that visit 06/16/2017 started Norvasc 5 mg daily. He presents for follow-up today. He reports that he has been taking the Norvasc 5 mg daily. He reports that they now have a digital blood pressure cuff at home and has been checking blood pressure with that.  States that he is still getting high readings.  Systolic has been 150s, 140s.  Got 130 once. Medication is causing no adverse effects.  He is having no lower extremity edema.  No lightheadedness. He has no other concerns to address today.     Home Meds:   Outpatient Medications Prior to Visit  Medication Sig Dispense Refill  . Adalimumab (HUMIRA [place]) Inject into the skin.    Marland Kitchen HYDROcodone-acetaminophen (NORCO/VICODIN) 5-325 MG tablet Take 1-2 tablets by mouth every 6 (six) hours as needed. 6 tablet 0  . amLODipine (NORVASC) 5 MG tablet Take 1 tablet (5 mg total) by mouth daily. 30 tablet 11   No facility-administered medications prior to visit.     Allergies: No Known Allergies    Review of Systems: See HPI for pertinent ROS. All other ROS negative.    Physical Exam: Blood pressure 134/86, pulse 80, temperature 98.1 F (36.7 C), temperature source Oral, resp. rate 16, height 5' 11.5" (1.816 m), weight 74.5 kg (164 lb 3.2 oz), SpO2 99 %., Body mass index is 22.58 kg/m. General:  WNWD WM. Appears in no acute distress. Neck: Supple. No thyromegaly. No lymphadenopathy. Lungs: Clear bilaterally to auscultation without wheezes, rales, or  rhonchi. Breathing is unlabored. Heart: Regular rhythm. No murmurs, rubs, or gallops. Msk:  Strength and tone normal for age. Extremities/Skin: Warm and dry.  No LE edema.  Neuro: Alert and oriented X 3. Moves all extremities spontaneously. Gait is normal. CNII-XII grossly in tact. Psych:  Responds to questions appropriately with a normal affect.     ASSESSMENT AND PLAN:  32 y.o. year old male with  1. Essential hypertension I rechecked blood pressure myself and am getting 154/98. As well he has continued to get high readings at home despite being on Norvasc 5 mg daily. At this time will increase Norvasc to 10 mg daily.  Will have him return for follow-up in 2 weeks.  Follow-up sooner if needed. - amLODipine (NORVASC) 10 MG tablet; Take 1 tablet (10 mg total) by mouth daily.  Dispense: 30 tablet; Refill: 0   Signed, [address] [place], [place], [facility] 06/30/2017 8:14 AM

## 2017-07-19 ENCOUNTER — Ambulatory Visit (INDEPENDENT_AMBULATORY_CARE_PROVIDER_SITE_OTHER): Payer: BLUE CROSS/BLUE SHIELD | Admitting: Physician Assistant

## 2017-07-19 ENCOUNTER — Encounter: Payer: Self-pay | Admitting: Physician Assistant

## 2017-07-19 VITALS — BP 134/92 | HR 83 | Temp 98.0°F | Resp 16 | Ht 71.5 in | Wt 164.6 lb

## 2017-07-19 DIAGNOSIS — I10 Essential (primary) hypertension: Secondary | ICD-10-CM

## 2017-07-19 MED ORDER — AMLODIPINE BESYLATE 10 MG PO TABS: 10.0000 mg | ORAL_TABLET | Freq: Every day | ORAL | 1 refills | Status: AC

## 2017-07-19 NOTE — Progress Notes (Signed)
Patient ID: Aleksander Edmiston MRN: [id number], DOB: February 13, 1985, 32 y.o. Date of Encounter: 07/19/2017, 3:45 PM    Chief Complaint:  Chief Complaint  Patient presents with  . blood pressure follow up     HPI: 32 y.o. year old male here to f/u HTN.   06/30/2017: He was here for CPE with me on 06/16/2017.  At that visit blood pressure was slightly elevated.  I reviewed that in the past he reported that his wife had checked his blood pressure at home and had gotten high readings there.  Therefore, at that visit 06/16/2017 started Norvasc 5 mg daily. He presents for follow-up today. He reports that he has been taking the Norvasc 5 mg daily. He reports that they now have a digital blood pressure cuff at home and has been checking blood pressure with that.  States that he is still getting high readings.  Systolic has been 150s, 140s.  Got 130 once. Medication is causing no adverse effects.  He is having no lower extremity edema.  No lightheadedness. He has no other concerns to address today.  A/P AT THAT OV ON 06/30/2017:  I rechecked blood pressure myself and am getting 154/98. As well he has continued to get high readings at home despite being on Norvasc 5 mg daily. At this time will increase Norvasc to 10 mg daily.  Will have him return for follow-up in 2 weeks.  Follow-up sooner if needed. - amLODipine (NORVASC) 10 MG tablet; Take 1 tablet (10 mg total) by mouth daily.  Dispense: 30 tablet; Refill: 0    07/19/2017: Today he reports that he did increase the Norvasc to 10 mg and has been taking this dose. Reports that he has checked his blood pressure about every other day since last visit.  Most readings are around 140. Asked about caffeine and energy drinks.  He states that he drinks 1 cup of coffee first thing in the morning at about 6 AM and then will drink 1 energy drink later in the morning once he is at work.  Says that that is it.  Does not consume any additional caffeine or energy drinks  after that.   Home Meds:   Outpatient Medications Prior to Visit  Medication Sig Dispense Refill  . Adalimumab (HUMIRA [place]) Inject into the skin.    Marland Kitchen amLODipine (NORVASC) 10 MG tablet Take 1 tablet (10 mg total) by mouth daily. 30 tablet 0  . HYDROcodone-acetaminophen (NORCO/VICODIN) 5-325 MG tablet Take 1-2 tablets by mouth every 6 (six) hours as needed. 6 tablet 0   No facility-administered medications prior to visit.     Allergies: No Known Allergies    Review of Systems: See HPI for pertinent ROS. All other ROS negative.   Physical Exam: Blood pressure (!) 134/92, pulse 83, temperature 98 F (36.7 C), temperature source Oral, resp. rate 16, height 5' 11.5" (1.816 m), weight 74.7 kg (164 lb 9.6 oz), SpO2 99 %., Body mass index is 22.64 kg/m. General:  WM. Appears in no acute distress. Neck: Supple. No thyromegaly. No lymphadenopathy. Lungs: Clear bilaterally to auscultation without wheezes, rales, or rhonchi. Breathing is unlabored. Heart: RRR with S1 S2. No murmurs, rubs, or gallops. Musculoskeletal:  Strength and tone normal for age. Extremities/Skin: Warm and dry.  Neuro: Alert and oriented X 3. Moves all extremities spontaneously. Gait is normal. CNII-XII grossly in tact. Psych:  Responds to questions appropriately with a normal affect.     ASSESSMENT AND PLAN:  32 y.o.  year old male with  1. Essential hypertension Continue current blood pressure medication and Norvasc 10 mg daily. He is to check blood pressure on a routine basis.  If getting greater than 140/90 he is to call us.  Otherwise we will plan for routine follow-up visit 6 months.  [address]igned, Delila Kuklinski Beth LanderDixon, [place], [facility] 07/19/2017 3:45 PM

## 2017-08-20 ENCOUNTER — Telehealth: Payer: Self-pay

## 2017-08-20 NOTE — Telephone Encounter (Signed)
Patient wife called and states patient has been having problems with patches of his hair fallen out behind his ears,Cassie patient's wife denies patient feeling lightheaded, dizzy, having any swelling. Please advise

## 2017-08-21 NOTE — Telephone Encounter (Signed)
Recommend him see Dermatology. Order Referral to Dermatology.

## 2017-08-23 NOTE — Telephone Encounter (Signed)
Call placed to patient he already has a dermatologist and will follow up with Dr. Dareen PianoAnderson at Dr Dorita SciaraLupton's office.

## 2017-09-03 DIAGNOSIS — L638 Other alopecia areata: Secondary | ICD-10-CM | POA: Diagnosis not present

## 2017-09-27 ENCOUNTER — Telehealth: Payer: Self-pay

## 2017-09-27 NOTE — Telephone Encounter (Signed)
I reviewed my office visit note dated 06/26/2017.   That note includes information from prior visits.   That note includes that in the past I had prescribed Wellbutrin and it did work well and it caused him no adverse effects.  Therefore, I am agreeable to send another prescription of Wellbutrin. Wellbutrin SR 150 mg----take 1 daily for 5 days then increase to 1 p.o. twice daily #60+3 refills.

## 2017-09-27 NOTE — Telephone Encounter (Signed)
Patient wife called and states patient is interested in starting Wellbutrin to help stop smoking. Patient wife Elonda HuskyCassandra states the medication was discussed at last office visit on 07/19/2017. Pls advise

## 2017-09-28 MED ORDER — BUPROPION HCL ER (SR) 150 MG PO TB12: ORAL_TABLET | ORAL | 3 refills | Status: DC

## 2017-09-28 NOTE — Telephone Encounter (Signed)
Call placed to Cassandra(wife) to notify her that the prescription has been sent to the pharmacy

## 2017-10-28 DIAGNOSIS — L409 Psoriasis, unspecified: Secondary | ICD-10-CM | POA: Diagnosis not present

## 2017-10-28 DIAGNOSIS — Z79899 Other long term (current) drug therapy: Secondary | ICD-10-CM | POA: Diagnosis not present

## 2017-10-28 DIAGNOSIS — L400 Psoriasis vulgaris: Secondary | ICD-10-CM | POA: Diagnosis not present

## 2017-11-25 DIAGNOSIS — Z23 Encounter for immunization: Secondary | ICD-10-CM | POA: Diagnosis not present

## 2017-11-25 DIAGNOSIS — I10 Essential (primary) hypertension: Secondary | ICD-10-CM | POA: Diagnosis not present

## 2017-11-25 DIAGNOSIS — L409 Psoriasis, unspecified: Secondary | ICD-10-CM | POA: Diagnosis not present

## 2017-11-25 DIAGNOSIS — Z720 Tobacco use: Secondary | ICD-10-CM | POA: Diagnosis not present

## 2018-01-04 DIAGNOSIS — L308 Other specified dermatitis: Secondary | ICD-10-CM | POA: Diagnosis not present

## 2018-01-04 DIAGNOSIS — L720 Epidermal cyst: Secondary | ICD-10-CM | POA: Diagnosis not present

## 2018-01-04 DIAGNOSIS — D485 Neoplasm of uncertain behavior of skin: Secondary | ICD-10-CM | POA: Diagnosis not present

## 2018-01-21 DIAGNOSIS — I10 Essential (primary) hypertension: Secondary | ICD-10-CM | POA: Diagnosis not present

## 2018-01-25 DIAGNOSIS — L720 Epidermal cyst: Secondary | ICD-10-CM | POA: Diagnosis not present

## 2018-01-25 DIAGNOSIS — D485 Neoplasm of uncertain behavior of skin: Secondary | ICD-10-CM | POA: Diagnosis not present

## 2018-01-26 ENCOUNTER — Other Ambulatory Visit: Payer: Self-pay | Admitting: *Deleted

## 2018-01-26 MED ORDER — BUPROPION HCL ER (SR) 150 MG PO TB12: ORAL_TABLET | ORAL | 3 refills | Status: DC

## 2018-03-23 ENCOUNTER — Telehealth: Payer: Self-pay

## 2018-03-23 NOTE — Telephone Encounter (Signed)
NOTES ON FILE 

## 2018-03-30 DIAGNOSIS — H0012 Chalazion right lower eyelid: Secondary | ICD-10-CM | POA: Diagnosis not present

## 2018-04-28 DIAGNOSIS — L400 Psoriasis vulgaris: Secondary | ICD-10-CM | POA: Diagnosis not present

## 2018-05-12 ENCOUNTER — Telehealth: Payer: Self-pay

## 2018-05-12 NOTE — Telephone Encounter (Signed)
Virtual Visit Pre-Appointment Phone Call  Steps For Call:  1. Confirm consent - "In the setting of the current Covid19 crisis, you are scheduled for a (phone or video) visit with your provider on (date) at (time).  Just as we do with many in-office visits, in order for you to participate in this visit, we must obtain consent.  If you'd like, I can send this to your mychart (if signed up) or email for you to review.  Otherwise, I can obtain your verbal consent now.  All virtual visits are billed to your insurance company just like a normal visit would be.  By agreeing to a virtual visit, we'd like you to understand that the technology does not allow for your provider to perform an examination, and thus may limit your provider's ability to fully assess your condition.  Finally, though the technology is pretty good, we cannot assure that it will always work on either your or our end, and in the setting of a video visit, we may have to convert it to a phone-only visit.  In either situation, we cannot ensure that we have a secure connection.  Are you willing to proceed?"  2. Give patient instructions for WebEx download to smartphone as below if video visit  3. Advise patient to be prepared with any vital sign or heart rhythm information, their current medicines, and a piece of paper and pen handy for any instructions they may receive the day of their visit  4. Inform patient they will receive a phone call 15 minutes prior to their appointment time (may be from unknown caller ID) so they should be prepared to answer  5. Confirm that appointment type is correct in Epic appointment notes (video vs telephone)    TELEPHONE CALL NOTE  Ralph Tapia has been deemed a candidate for a follow-up tele-health visit to limit community exposure during the Covid-19 pandemic. I spoke with the patient via phone to ensure availability of phone/video source, confirm preferred email & phone number, and discuss  instructions and expectations.  I reminded Ralph Tapia to be prepared with any vital sign and/or heart rhythm information that could potentially be obtained via home monitoring, at the time of his visit. I reminded Ralph Tapia to expect a phone call at the time of his visit if his visit.  Did the patient verbally acknowledge consent to treatment? yes  Ralph Tapia, [facility] 05/12/2018 12:47 PM   DOWNLOADING THE WEBEX SOFTWARE TO SMARTPHONE  - If Apple, go to [employer] and type in WebEx in the search bar. Download [employer], the blue/green circle. The app is free but as with any other app downloads, their phone may require them to verify saved payment information or Apple password. The patient does NOT have to create an account.  - If Android, ask patient to go to [employer] and type in WebEx in the search bar. Download [employer], the blue/green circle. The app is free but as with any other app downloads, their phone may require them to verify saved payment information or Android password. The patient does NOT have to create an account.   CONSENT FOR TELE-HEALTH VISIT - PLEASE REVIEW  I hereby voluntarily request, consent and authorize [facility] and its employed or contracted physicians, physician assistants, nurse practitioners or other licensed health care professionals (the Practitioner), to provide me with telemedicine health care services (the "Services") as deemed necessary by the treating Practitioner. I acknowledge and  consent to receive the Services by the Practitioner via telemedicine. I understand that the telemedicine visit will involve communicating with the Practitioner through live audiovisual communication technology and the disclosure of certain medical information by electronic transmission. I acknowledge that I have been given the opportunity to request an in-person assessment or other available alternative prior to the telemedicine visit and am  voluntarily participating in the telemedicine visit.  I understand that I have the right to withhold or withdraw my consent to the use of telemedicine in the course of my care at any time, without affecting my right to future care or treatment, and that the Practitioner or I may terminate the telemedicine visit at any time. I understand that I have the right to inspect all information obtained and/or recorded in the course of the telemedicine visit and may receive copies of available information for a reasonable fee.  I understand that some of the potential risks of receiving the Services via telemedicine include:  Marland Kitchen Delay or interruption in medical evaluation due to technological equipment failure or disruption; . Information transmitted may not be sufficient (e.g. poor resolution of images) to allow for appropriate medical decision making by the Practitioner; and/or  . In rare instances, security protocols could fail, causing a breach of personal health information.  Furthermore, I acknowledge that it is my responsibility to provide information about my medical history, conditions and care that is complete and accurate to the best of my ability. I acknowledge that Practitioner's advice, recommendations, and/or decision may be based on factors not within their control, such as incomplete or inaccurate data provided by me or distortions of diagnostic images or specimens that may result from electronic transmissions. I understand that the practice of medicine is not an exact science and that Practitioner makes no warranties or guarantees regarding treatment outcomes. I acknowledge that I will receive a copy of this consent concurrently upon execution via email to the email address I last provided but may also request a printed copy by calling the office of [place].    I understand that my insurance will be billed for this visit.   I have read or had this consent read to me. . I understand the  contents of this consent, which adequately explains the benefits and risks of the Services being provided via telemedicine.  . I have been provided ample opportunity to ask questions regarding this consent and the Services and have had my questions answered to my satisfaction. . I give my informed consent for the services to be provided through the use of telemedicine in my medical care  By participating in this telemedicine visit I agree to the above.

## 2018-05-13 ENCOUNTER — Telehealth (INDEPENDENT_AMBULATORY_CARE_PROVIDER_SITE_OTHER): Payer: BLUE CROSS/BLUE SHIELD | Admitting: Interventional Cardiology

## 2018-05-13 ENCOUNTER — Other Ambulatory Visit: Payer: Self-pay

## 2018-05-13 ENCOUNTER — Encounter: Payer: Self-pay | Admitting: Interventional Cardiology

## 2018-05-13 DIAGNOSIS — Z7289 Other problems related to lifestyle: Secondary | ICD-10-CM

## 2018-05-13 DIAGNOSIS — Z789 Other specified health status: Secondary | ICD-10-CM

## 2018-05-13 DIAGNOSIS — I10 Essential (primary) hypertension: Secondary | ICD-10-CM

## 2018-05-13 DIAGNOSIS — F17200 Nicotine dependence, unspecified, uncomplicated: Secondary | ICD-10-CM

## 2018-05-13 NOTE — Patient Instructions (Addendum)
Medication Instructions:  Your physician recommends that you continue on your current medications as directed. Please refer to the Current Medication list given to you today.  YOU MAY TRY MELATONIN AT NIGHT FOR SLEEP  If you need a refill on your cardiac medications before your next appointment, please call your pharmacy.   Lab work: None Ordered  If you have labs (blood work) drawn today and your tests are completely normal, you will receive your results only by: Marland Kitchen MyChart Message (if you have MyChart) OR . A paper copy in the mail If you have any lab test that is abnormal or we need to change your treatment, we will call you to review the results.  Testing/Procedures: None ordered  Follow-Up: Follow up with Dr. Eldridge Dace for VIDEO Visit on 06/29/18 at 11:40 AM  Any Other Special Instructions Will Be Listed Below (If Applicable).  1. Decrease your energy drink and alcohol consumption  2. You should follow a low salt diet  3. Your provider recommends that you maintain 150 minutes per week of moderate aerobic activity.  3. STOP SMOKING   Steps to Quit Smoking  Smoking tobacco can be bad for your health. It can also affect almost every organ in your body. Smoking puts you and people around you at risk for many serious long-lasting (chronic) diseases. Quitting smoking is hard, but it is one of the best things that you can do for your health. It is never too late to quit. What are the benefits of quitting smoking? When you quit smoking, you lower your risk for getting serious diseases and conditions. They can include:  Lung cancer or lung disease.  Heart disease.  Stroke.  Heart attack.  Not being able to have children (infertility).  Weak bones (osteoporosis) and broken bones (fractures). If you have coughing, wheezing, and shortness of breath, those symptoms may get better when you quit. You may also get sick less often. If you are pregnant, quitting smoking can help to  lower your chances of having a baby of low birth weight. What can I do to help me quit smoking? Talk with your doctor about what can help you quit smoking. Some things you can do (strategies) include:  Quitting smoking totally, instead of slowly cutting back how much you smoke over a period of time.  Going to in-person counseling. You are more likely to quit if you go to many counseling sessions.  Using resources and support systems, such as: ? Agricultural engineer with a Veterinary surgeon. ? Phone quitlines. ? Automotive engineer. ? Support groups or group counseling. ? Text messaging programs. ? Mobile phone apps or applications.  Taking medicines. Some of these medicines may have nicotine in them. If you are pregnant or breastfeeding, do not take any medicines to quit smoking unless your doctor says it is okay. Talk with your doctor about counseling or other things that can help you. Talk with your doctor about using more than one strategy at the same time, such as taking medicines while you are also going to in-person counseling. This can help make quitting easier. What things can I do to make it easier to quit? Quitting smoking might feel very hard at first, but there is a lot that you can do to make it easier. Take these steps:  Talk to your family and friends. Ask them to support and encourage you.  Call phone quitlines, reach out to support groups, or work with a Veterinary surgeon.  Ask people who smoke to  not smoke around you.  Avoid places that make you want (trigger) to smoke, such as: ? Bars. ? Parties. ? Smoke-break areas at work.  Spend time with people who do not smoke.  Lower the stress in your life. Stress can make you want to smoke. Try these things to help your stress: ? Getting regular exercise. ? Deep-breathing exercises. ? Yoga. ? Meditating. ? Doing a body scan. To do this, close your eyes, focus on one area of your body at a time from head to toe, and notice which parts  of your body are tense. Try to relax the muscles in those areas.  Download or buy apps on your mobile phone or tablet that can help you stick to your quit plan. There are many free apps, such as QuitGuide from the [employer] developer for Disease Control and Prevention). You can find more support from smokefree.gov and other websites. This information is not intended to replace advice given to you by your health care provider. Make sure you discuss any questions you have with your health care provider. Document Released: 11/22/2008 Document Revised: 09/24/2015 Document Reviewed: 06/12/2014 Elsevier Interactive Patient Education  2019 Elsevier Inc.  Low-Sodium Eating Plan Sodium, which is an element that makes up salt, helps you maintain a healthy balance of fluids in your body. Too much sodium can increase your blood pressure and cause fluid and waste to be held in your body. Your health care provider or dietitian may recommend following this plan if you have high blood pressure (hypertension), kidney disease, liver disease, or heart failure. Eating less sodium can help lower your blood pressure, reduce swelling, and protect your heart, liver, and kidneys. What are tips for following this plan? General guidelines  Most people on this plan should limit their sodium intake to 1,500-2,000 mg (milligrams) of sodium each day. Reading food labels   The Nutrition Facts label lists the amount of sodium in one serving of the food. If you eat more than one serving, you must multiply the listed amount of sodium by the number of servings.  Choose foods with less than 140 mg of sodium per serving.  Avoid foods with 300 mg of sodium or more per serving. Shopping  Look for lower-sodium products, often labeled as "low-sodium" or "no salt added."  Always check the sodium content even if foods are labeled as "unsalted" or "no salt added".  Buy fresh foods. ? Avoid canned foods and premade or frozen  meals. ? Avoid canned, cured, or processed meats  Buy breads that have less than 80 mg of sodium per slice. Cooking  Eat more home-cooked food and less restaurant, buffet, and fast food.  Avoid adding salt when cooking. Use salt-free seasonings or herbs instead of table salt or sea salt. Check with your health care provider or pharmacist before using salt substitutes.  Cook with plant-based oils, such as canola, sunflower, or olive oil. Meal planning  When eating at a restaurant, ask that your food be prepared with less salt or no salt, if possible.  Avoid foods that contain MSG (monosodium glutamate). MSG is sometimes added to [place] food, bouillon, and some canned foods. What foods are recommended? The items listed may not be a complete list. Talk with your dietitian about what dietary choices are best for you. Grains Low-sodium cereals, including oats, puffed wheat and rice, and shredded wheat. Low-sodium crackers. Unsalted rice. Unsalted pasta. Low-sodium bread. Whole-grain breads and whole-grain pasta. Vegetables Fresh or frozen vegetables. "No salt added"  canned vegetables. "No salt added" tomato sauce and paste. Low-sodium or reduced-sodium tomato and vegetable juice. Fruits Fresh, frozen, or canned fruit. Fruit juice. Meats and other protein foods Fresh or frozen (no salt added) meat, poultry, seafood, and fish. Low-sodium canned tuna and salmon. Unsalted nuts. Dried peas, beans, and lentils without added salt. Unsalted canned beans. Eggs. Unsalted nut butters. Dairy Milk. Soy milk. Cheese that is naturally low in sodium, such as ricotta cheese, fresh mozzarella, or Swiss cheese Low-sodium or reduced-sodium cheese. Cream cheese. Yogurt. Fats and oils Unsalted butter. Unsalted margarine with no trans fat. Vegetable oils such as canola or olive oils. Seasonings and other foods Fresh and dried herbs and spices. Salt-free seasonings. Low-sodium mustard and ketchup. Sodium-free  salad dressing. Sodium-free light mayonnaise. Fresh or refrigerated horseradish. Lemon juice. Vinegar. Homemade, reduced-sodium, or low-sodium soups. Unsalted popcorn and pretzels. Low-salt or salt-free chips. What foods are not recommended? The items listed may not be a complete list. Talk with your dietitian about what dietary choices are best for you. Grains Instant hot cereals. Bread stuffing, pancake, and biscuit mixes. Croutons. Seasoned rice or pasta mixes. Noodle soup cups. Boxed or frozen macaroni and cheese. Regular salted crackers. Self-rising flour. Vegetables Sauerkraut, pickled vegetables, and relishes. Olives. [place] fries. Onion rings. Regular canned vegetables (not low-sodium or reduced-sodium). Regular canned tomato sauce and paste (not low-sodium or reduced-sodium). Regular tomato and vegetable juice (not low-sodium or reduced-sodium). Frozen vegetables in sauces. Meats and other protein foods Meat or fish that is salted, canned, smoked, spiced, or pickled. Bacon, ham, sausage, hotdogs, corned beef, chipped beef, packaged lunch meats, salt pork, jerky, pickled herring, anchovies, regular canned tuna, sardines, salted nuts. Dairy Processed cheese and cheese spreads. Cheese curds. Blue cheese. Feta cheese. String cheese. Regular cottage cheese. Buttermilk. Canned milk. Fats and oils Salted butter. Regular margarine. Ghee. Bacon fat. Seasonings and other foods Onion salt, garlic salt, seasoned salt, table salt, and sea salt. Canned and packaged gravies. Worcestershire sauce. Tartar sauce. Barbecue sauce. Teriyaki sauce. Soy sauce, including reduced-sodium. Steak sauce. Fish sauce. Oyster sauce. Cocktail sauce. Horseradish that you find on the shelf. Regular ketchup and mustard. Meat flavorings and tenderizers. Bouillon cubes. Hot sauce and Tabasco sauce. Premade or packaged marinades. Premade or packaged taco seasonings. Relishes. Regular salad dressings. Salsa. Potato and tortilla  chips. Corn chips and puffs. Salted popcorn and pretzels. Canned or dried soups. Pizza. Frozen entrees and pot pies. Summary  Eating less sodium can help lower your blood pressure, reduce swelling, and protect your heart, liver, and kidneys.  Most people on this plan should limit their sodium intake to 1,500-2,000 mg (milligrams) of sodium each day.  Canned, boxed, and frozen foods are high in sodium. Restaurant foods, fast foods, and pizza are also very high in sodium. You also get sodium by adding salt to food.  Try to cook at home, eat more fresh fruits and vegetables, and eat less fast food, canned, processed, or prepared foods. This information is not intended to replace advice given to you by your health care provider. Make sure you discuss any questions you have with your health care provider. Document Released: 07/18/2001 Document Revised: 01/20/2016 Document Reviewed: 01/20/2016 Elsevier Interactive Patient Education  2019 [employer].

## 2018-05-13 NOTE — Progress Notes (Signed)
Virtual Visit via Video Note    Evaluation Performed:  Follow-up visit  This visit type was conducted due to national recommendations for restrictions regarding the COVID-19 Pandemic (e.g. social distancing).  This format is felt to be most appropriate for this patient at this time.  All issues noted in this document were discussed and addressed.  No physical exam was performed (except for noted visual exam findings with Video Visits).  Please refer to the patient's chart (MyChart message for video visits and phone note for telephone visits) for the patient's consent to telehealth for [facility].  Date:  05/13/2018   ID:  Ralph Tapia, DOB 04-May-1985, MRN [id number]  Patient Location:  Home  Provider location:   [address] [facility]  PCP:  Dr.  Tally Joe Cardiologist:  No primary care provider on file. Mikeya Tomasetti Electrophysiologist:  None   Chief Complaint:  Hypertension  History of Present Illness:    Ralph Tapia is a 33 y.o. male who presents via audio/video conferencing for a telehealth visit today.    Ralph Tapia is a 33 y.o. male who is being seen today for the evaluation of hypertension at the request of Tally Joe, MD.  He was started medicine for BP 6 months ago.  Amlodipine was started and then the dose was increased.  He still had elevated diastolic readings.  A second medicine was started , lisinopril, but he did not tolerate this.  He did nit tolerate HCTZ.  Both parents have HTN requiring medication.    Denies : Chest pain. Dizziness. Leg edema. Nitroglycerin use. Orthopnea. Palpitations. Paroxysmal nocturnal dyspnea. Shortness of breath. Syncope.   He does not have any sx when his BP is high.  He is a Production designer, theatre/television/film for [employer].  Home readings are in usually in the 135/90 range.    He does not do any dedicated exercise.  He consumes alcohol and energy drinks frequently.  He smokes.  His sleep is sometimes suboptimal.    The patient does not have symptoms  concerning for COVID-19 infection (fever, chills, cough, or new shortness of breath).    Prior CV studies:   The following studies were reviewed today:  Records from Dr. Azucena Cecil reviewed.    Past Medical History:  Diagnosis Date  . Alcohol dependence (HCC) 01/21/2015  . Chronic tonsillitis 08/2011   snores during sleep, occ. stops breathing, and wakes up gasping/choking; has not had sleep study  . Essential hypertension 06/16/2017  . Psoriasis 12/11/2013  . Smoker   . Tobacco abuse    Past Surgical History:  Procedure Laterality Date  . FRACTURE SURGERY Right 2009   orif  . LAPAROSCOPIC APPENDECTOMY  12/21/2004  . ORIF FIBULA FRACTURE  08/18/2007   right distal fib.  . TONSILLECTOMY  08/18/2011   Procedure: TONSILLECTOMY;  Surgeon: Drema Halon, MD;  Location: [facility];  Service: ENT;  Laterality: Bilateral;  . TONSILLECTOMY  2013     Current Meds  Medication Sig  . Adalimumab (HUMIRA [place]) Inject into the skin.  Marland Kitchen amLODipine (NORVASC) 10 MG tablet Take 1 tablet (10 mg total) by mouth daily.     Allergies:   Patient has no known allergies.   Social History   Tobacco Use  . Smoking status: Current Every Day Smoker    Packs/day: 0.50    Years: 3.00    Pack years: 1.50    Types: Cigarettes  . Smokeless tobacco: Never Used  Substance Use Topics  . Alcohol use:  Yes    Comment: 3-4 x/week  . Drug use: No     Family Hx: The patient's family history includes Hypertension in his father and mother.  ROS:   Please see the history of present illness.     All other systems reviewed and are negative.   Labs/Other Tests and Data Reviewed:    Recent Labs: 06/16/2017: ALT 19; BUN 16; Creat 0.96; Hemoglobin 16.2; Platelets 272; Potassium 4.7; Sodium 140; TSH 0.50   Recent Lipid Panel Lab Results  Component Value Date/Time   CHOL 201 (H) 06/16/2017 09:19 AM   TRIG 44 06/16/2017 09:19 AM   HDL 58 06/16/2017 09:19 AM   CHOLHDL 3.5 06/16/2017 09:19 AM    LDLCALC 130 (H) 06/16/2017 09:19 AM    Wt Readings from Last 3 Encounters:  05/13/18 155 lb (70.3 kg)  07/19/17 164 lb 9.6 oz (74.7 kg)  06/30/17 164 lb 3.2 oz (74.5 kg)     Objective:    Vital Signs:  BP (!) 139/93   Pulse 84   Ht 6' (1.829 m)   Wt 155 lb (70.3 kg)   BMI 21.02 kg/m    Well nourished, well developed male in no acute distress. No obvious shortness of breath  ASSESSMENT & PLAN:    1.  HTN: Continue amlodipine.  Diastolics typically a little bit over 90.  Systolics in the 130s.  Would not add another medicine at this time.  WOuld improve lifestyle.  Decrease alcohol and energy drink intake.  Decrease red meat and salt intake. Decrease consumption of processed foods.  Increase exercise to 150 minutes / week.  Try to get regular sleep.  Avoid screentime just before bed to help with sleep.  Check BP at home when relaxed.   If lifestyle measures fail, will have to consider a second drug, perhaps low dose Carvedilol.  I would prefer to not add another drug.    He needs to stop smoking as well.    COVID-19 Education: The signs and symptoms of COVID-19 were discussed with the patient and how to seek care for testing (follow up with PCP or arrange E-visit).  The importance of social distancing was discussed today, especially since he works at [employer].  Patient Risk:   After full review of this patient's clinical status, I feel that they are at least moderate risk at this time.  Time:   Today, I have spent 27 minutes with the patient with telehealth technology discussing HTN, lifestyle changes.     Medication Adjustments/Labs and Tests Ordered: Current medicines are reviewed at length with the patient today.  Concerns regarding medicines are outlined above.  Tests Ordered: No orders of the defined types were placed in this encounter.  Medication Changes: No orders of the defined types were placed in this encounter.   Disposition:  Follow up in 6 week(s)   Signed, Lance Muss, MD  05/13/2018 4:15 PM    [facility]

## 2018-06-28 NOTE — Progress Notes (Deleted)
{Choose 1 Note Type (Telehealth Visit or Telephone Visit):[phone]}   Date:  06/28/2018   ID:  Ralph Tapia, DOB 1985-11-27, MRN [id number]  {Patient [phone]::"Home"} {Provider [phone]::"Home"}  PCP:  Tally Joe, MD  Cardiologist:  No primary care provider on file. *** Electrophysiologist:  None   Evaluation Performed:  {Choose Visit [phone]::"Follow-Up Visit"}  Chief Complaint:  ***  History of Present Illness:    Ralph Tapia is a 33 y.o. male with HTN.  He was started medicine for BP 6 months ago.  Amlodipine was started and then the dose was increased.  He still had elevated diastolic readings.  A second medicine was started , lisinopril, but he did not tolerate this.  He did nit tolerate HCTZ.  Both parents have HTN requiring medication.    At his visit in 05/2018, plan was : Continue amlodipine.  Diastolics typically a little bit over 90.  Systolics in the 130s.  Would not add another medicine at this time.  WOuld improve lifestyle.  Decrease alcohol and energy drink intake.  Decrease red meat and salt intake. Decrease consumption of processed foods.  Increase exercise to 150 minutes / week.  Try to get regular sleep.  Avoid screentime just before bed to help with sleep.  Check BP at home when relaxed.   If lifestyle measures fail, will have to consider a second drug, perhaps low dose Carvedilol.  I would prefer to not add another drug.    He needs to stop smoking as well.    The patient {does/does not:200015} have symptoms concerning for COVID-19 infection (fever, chills, cough, or new shortness of breath).    Past Medical History:  Diagnosis Date  . Alcohol dependence (HCC) 01/21/2015  . Chronic tonsillitis 08/2011   snores during sleep, occ. stops breathing, and wakes up gasping/choking; has not had sleep study  . Essential hypertension 06/16/2017  . Psoriasis 12/11/2013  . Smoker   . Tobacco abuse    Past Surgical History:   Procedure Laterality Date  . FRACTURE SURGERY Right 2009   orif  . LAPAROSCOPIC APPENDECTOMY  12/21/2004  . ORIF FIBULA FRACTURE  08/18/2007   right distal fib.  . TONSILLECTOMY  08/18/2011   Procedure: TONSILLECTOMY;  Surgeon: Drema Halon, MD;  Location: [facility];  Service: ENT;  Laterality: Bilateral;  . TONSILLECTOMY  2013     No outpatient medications have been marked as taking for the 06/29/18 encounter (Appointment) with Corky Crafts, MD.     Allergies:   Patient has no known allergies.   Social History   Tobacco Use  . Smoking status: Current Every Day Smoker    Packs/day: 0.50    Years: 3.00    Pack years: 1.50    Types: Cigarettes  . Smokeless tobacco: Never Used  Substance Use Topics  . Alcohol use: Yes    Comment: 3-4 x/week  . Drug use: No     Family Hx: The patient's family history includes Hypertension in his father and mother.  ROS:   Please see the history of present illness.    *** All other systems reviewed and are negative.   Prior CV studies:   The following studies were reviewed today:  ***  Labs/Other Tests and Data Reviewed:    EKG:  {EKG/Telemetry Strips [phone]}  Recent Labs: No results found for requested labs within last 8760 hours.   Recent Lipid Panel Lab Results  Component Value Date/Time   CHOL 201 (H) 06/16/2017 09:19 AM  TRIG 44 06/16/2017 09:19 AM   HDL 58 06/16/2017 09:19 AM   CHOLHDL 3.5 06/16/2017 09:19 AM   LDLCALC 130 (H) 06/16/2017 09:19 AM    Wt Readings from Last 3 Encounters:  05/13/18 155 lb (70.3 kg)  07/19/17 164 lb 9.6 oz (74.7 kg)  06/30/17 164 lb 3.2 oz (74.5 kg)     Objective:    Vital Signs:  There were no vitals taken for this visit.   {HeartCare Virtual Exam (Optional):[phone]::"VITAL SIGNS:  reviewed"}  ASSESSMENT & PLAN:    1. HTN:   2. Smoker: 3. Alcohol use:  COVID-19 Education: The signs and symptoms of COVID-19 were discussed with  the patient and how to seek care for testing (follow up with PCP or arrange E-visit).  ***The importance of social distancing was discussed today.  Time:   Today, I have spent *** minutes with the patient with telehealth technology discussing the above problems.     Medication Adjustments/Labs and Tests Ordered: Current medicines are reviewed at length with the patient today.  Concerns regarding medicines are outlined above.   Tests Ordered: No orders of the defined types were placed in this encounter.   Medication Changes: No orders of the defined types were placed in this encounter.   Disposition:  Follow up {follow up:15908}  Signed, Lance MussJayadeep Oceanna Arruda, MD  06/28/2018 3:12 PM    [facility]

## 2018-06-29 ENCOUNTER — Telehealth: Payer: BLUE CROSS/BLUE SHIELD | Admitting: Interventional Cardiology

## 2018-09-08 DIAGNOSIS — I10 Essential (primary) hypertension: Secondary | ICD-10-CM | POA: Diagnosis not present

## 2018-09-08 DIAGNOSIS — R05 Cough: Secondary | ICD-10-CM | POA: Diagnosis not present

## 2018-09-08 DIAGNOSIS — Z720 Tobacco use: Secondary | ICD-10-CM | POA: Diagnosis not present

## 2018-09-08 DIAGNOSIS — L409 Psoriasis, unspecified: Secondary | ICD-10-CM | POA: Diagnosis not present

## 2018-09-09 ENCOUNTER — Ambulatory Visit
Admission: RE | Admit: 2018-09-09 | Discharge: 2018-09-09 | Disposition: A | Discharge status: Home / Self Care | Payer: BC Managed Care – PPO | Source: Ambulatory Visit | Attending: Family Medicine | Admitting: Family Medicine

## 2018-09-09 ENCOUNTER — Other Ambulatory Visit: Payer: Self-pay | Admitting: Family Medicine

## 2018-09-09 DIAGNOSIS — R05 Cough: Secondary | ICD-10-CM

## 2018-09-09 DIAGNOSIS — R053 Chronic cough: Secondary | ICD-10-CM

## 2018-09-14 ENCOUNTER — Other Ambulatory Visit: Payer: Self-pay | Admitting: Podiatry

## 2018-09-14 ENCOUNTER — Encounter: Payer: Self-pay | Admitting: Podiatry

## 2018-09-14 ENCOUNTER — Ambulatory Visit (INDEPENDENT_AMBULATORY_CARE_PROVIDER_SITE_OTHER): Payer: BC Managed Care – PPO | Admitting: Podiatry

## 2018-09-14 ENCOUNTER — Ambulatory Visit (INDEPENDENT_AMBULATORY_CARE_PROVIDER_SITE_OTHER): Payer: BC Managed Care – PPO

## 2018-09-14 ENCOUNTER — Other Ambulatory Visit: Payer: Self-pay

## 2018-09-14 VITALS — BP 144/99 | HR 82 | Temp 97.3°F | Resp 16

## 2018-09-14 DIAGNOSIS — M79671 Pain in right foot: Secondary | ICD-10-CM

## 2018-09-14 DIAGNOSIS — M779 Enthesopathy, unspecified: Secondary | ICD-10-CM | POA: Diagnosis not present

## 2018-09-14 DIAGNOSIS — M205X1 Other deformities of toe(s) (acquired), right foot: Secondary | ICD-10-CM

## 2018-09-14 NOTE — Progress Notes (Signed)
Subjective:   Patient ID: Ralph Tapia, male   DOB: 33 y.o.   MRN: [id number]   HPI Patient presents stating he is felt a popping and pain in his right forefoot and also he does have some discomfort at times in the big toe joint and it is enlarged.  Patient smokes approximately a pack per day and would like to be more active   Review of Systems  All other systems reviewed and are negative.       Objective:  Physical Exam Vitals signs and nursing note reviewed.  Constitutional:      Appearance: He is well-developed.  Pulmonary:     Effort: Pulmonary effort is normal.  Musculoskeletal: Normal range of motion.  Skin:    General: Skin is warm.  Neurological:     Mental Status: He is alert.     Neurovascular status found to be intact muscle strength is adequate range of motion within normal limits.  Patient is found to have discomfort in the second metatarsal phalangeal joint right with fluid buildup around the joint surface and does have reduced range of motion of the first MPJ right with moderate swelling occurring around the joint and mild discomfort.  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  I do think ultimately this first MPJ may need to be addressed organist start off by focusing on trying to reduce the stress on the joint and I did a proximal forefoot block 60 mg like Marcaine mixture sterile prep and then aspirated the second MPJ getting out a small amount of clear fluid and injected with 1/4 cc dexamethasone Kenalog along with thick plantar padding.  I discussed orthotics to try to reduce the stress on the joint and we will have him back in 2 weeks to see how he stands  X-rays indicate  that there is elevation of the first metatarsal segment right with indications of arthritis around the joint surface no pathology noted around the second MPJ signed visit

## 2018-09-14 NOTE — Progress Notes (Signed)
   Subjective:    Patient ID: Ralph Tapia, male    DOB: [birth year], 33 y.o.   MRN: [id number]  HPI    Review of Systems  All other systems reviewed and are negative.      Objective:   Physical Exam        Assessment & Plan:

## 2018-09-28 ENCOUNTER — Ambulatory Visit (INDEPENDENT_AMBULATORY_CARE_PROVIDER_SITE_OTHER): Payer: BC Managed Care – PPO | Admitting: Podiatry

## 2018-09-28 ENCOUNTER — Encounter: Payer: Self-pay | Admitting: Podiatry

## 2018-09-28 ENCOUNTER — Other Ambulatory Visit: Payer: Self-pay

## 2018-09-28 VITALS — Temp 97.3°F

## 2018-09-28 DIAGNOSIS — M205X1 Other deformities of toe(s) (acquired), right foot: Secondary | ICD-10-CM | POA: Diagnosis not present

## 2018-09-28 DIAGNOSIS — M779 Enthesopathy, unspecified: Secondary | ICD-10-CM

## 2018-09-28 NOTE — Progress Notes (Signed)
Subjective:   Patient ID: Ralph Tapia, male   DOB: 33 y.o.   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  Discussed ultimately surgical intervention for this case and educated him on this

## 2018-10-10 ENCOUNTER — Telehealth: Payer: Self-pay | Admitting: Pulmonary Disease

## 2018-10-10 NOTE — Telephone Encounter (Signed)
Ashley Akin, RN called patient and clarified blood-tinged mucous and not frank blood. Quantified to <1 tsp.   OK for patient to come into clinic.  JE

## 2018-10-10 NOTE — Telephone Encounter (Signed)
Called pt to screen for covid for appt. On 10/11/2018. Pt c/o cough that has been going on for 1year, but is now coughing up blood. Pt states that he has not been tested for covid. Advised that I would send a message to the provider to advise on if ok to keep appt. Or need to be tested and that someone will reach back out to him today if we need to make any changes to his appt. Pt verbalized understanding.

## 2018-10-10 NOTE — Telephone Encounter (Signed)
Called spoke with patient to determine the severity of the coughing up blood, he said it is streaks mainly in morning while brushing his teeth. Let JE know she verified that this is okay for patient to come to visit on 10/11/18. Nothing further needed at this time.

## 2018-10-10 NOTE — Progress Notes (Signed)
Cardiology Office Note   Date:  10/11/2018   ID:  Ralph Tapia Capili, DOB 1985-10-04, MRN [id number]  PCP:  Tally JoeSwayne, David, MD    No chief complaint on file.  HTN  Wt Readings from Last 3 Encounters:  10/11/18 174 lb 12.8 oz (79.3 kg)  10/11/18 175 lb 3.2 oz (79.5 kg)  05/13/18 155 lb (70.3 kg)       History of Present Illness: Ralph Tapia Uffelman is a 33 y.o. male  With HTN.  He was started medicine for BP in 2019.  Amlodipine was started and then the dose was increased.  He still had elevated diastolic readings.  A second medicine was started , lisinopril, but he did not tolerate this.  He did not tolerate HCTZ.  Both parents have HTN requiring medication.  He is not sure when they started on meds.   At 05/2018 visit, it was noted that: " He does not have any sx when his BP is high.  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  Home readings are in usually in the 135/90 range."  Plan at that time was: "HTN: Continue amlodipine.  Diastolics typically a little bit over 90.  Systolics in the 130s.  Would not add another medicine at this time.  WOuld improve lifestyle.  Decrease alcohol and energy drink intake.  Decrease red meat and salt intake. Decrease consumption of processed foods.  Increase exercise to 150 minutes / week.  Try to get regular sleep.  Avoid screentime just before bed to help with sleep.  Check BP at home when relaxed.   If lifestyle measures fail, will have to consider a second drug, perhaps low dose Carvedilol.  I would prefer to not add another drug.    He needs to stop smoking as well.  "  At home, diastolics are in the low 90s, systolics in the 120s - 140s.  Denies : Chest pain. Dizziness. Leg edema. Nitroglycerin use. Orthopnea. Palpitations. Paroxysmal nocturnal dyspnea. Shortness of breath. Syncope.   Walks  A lot at work.  He has tried to decrease salt intake.        Past Medical History:  Diagnosis Date  . Alcohol dependence (HCC) 01/21/2015  . Chronic  tonsillitis 08/2011   snores during sleep, occ. stops breathing, and wakes up gasping/choking; has not had sleep study  . Essential hypertension 06/16/2017  . Psoriasis 12/11/2013  . Smoker   . Tobacco abuse     Past Surgical History:  Procedure Laterality Date  . FRACTURE SURGERY Right 2009   orif  . LAPAROSCOPIC APPENDECTOMY  12/21/2004  . ORIF FIBULA FRACTURE  08/18/2007   right distal fib.  . TONSILLECTOMY  08/18/2011   Procedure: TONSILLECTOMY;  Surgeon: Drema Halonhristopher E Newman, MD;  Location: [facility];  Service: ENT;  Laterality: Bilateral;  . TONSILLECTOMY  2013     Current Outpatient Medications  Medication Sig Dispense Refill  . Adalimumab (HUMIRA [place]) Inject into the skin.    Marland Kitchen. amLODipine (NORVASC) 10 MG tablet Take 1 tablet (10 mg total) by mouth daily. 90 tablet 1  . benzonatate (TESSALON) 200 MG capsule Take 1 capsule (200 mg total) by mouth 3 (three) times daily as needed for cough. 21 capsule 0  . fluticasone (CUTIVATE) 0.05 % cream      No current facility-administered medications for this visit.     Allergies:   Patient has no known allergies.    Social History:  The patient  reports that he  has been smoking cigarettes. He started smoking about 15 years ago. He has a 15.00 pack-year smoking history. He has never used smokeless tobacco. He reports current alcohol use. He reports that he does not use drugs.   Family History:  The patient's family history includes Hypertension in his father and mother.    ROS:  Please see the history of present illness.   Otherwise, review of systems are positive for continued high readings, particularly diastolic.   All other systems are reviewed and negative.    PHYSICAL EXAM: VS:  BP (!) 144/96   Pulse 87   Ht 6' (1.829 m)   Wt 174 lb 12.8 oz (79.3 kg)   SpO2 99%   BMI 23.71 kg/m  , BMI Body mass index is 23.71 kg/m. GEN: Well nourished, well developed, in no acute distress  HEENT: normal  Neck: no JVD,  carotid bruits, or masses Cardiac: RRR; no murmurs, rubs, or gallops,no edema  Respiratory:  clear to auscultation bilaterally, normal work of breathing GI: soft, nontender, nondistended, + BS MS: no deformity or atrophy  Skin: warm and dry, no rash Neuro:  Strength and sensation are intact Psych: euthymic mood, full affect   EKG:   The ekg ordered today demonstrates NSR, LVH   Recent Labs: No results found for requested labs within last 8760 hours.   Lipid Panel    Component Value Date/Time   CHOL 201 (H) 06/16/2017 0919   TRIG 44 06/16/2017 0919   HDL 58 06/16/2017 0919   CHOLHDL 3.5 06/16/2017 0919   VLDL 10 10/05/2012 1033   LDLCALC 130 (H) 06/16/2017 0919     Other studies Reviewed: Additional studies/ records that were reviewed today with results demonstrating: Home BP readings.   ASSESSMENT AND PLAN:  1. HTN:  Add carvedilol 3.125 mg BID.  COntinue to check BP at home.  Did not tolerate HCTZ or lisinopril. Minimize alcohol use. 2. Tobacco: Still smoking.  He feels that his mind is not made up.  Feels a lot of stress at work. 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 3. COntinue regular exercise.     Current medicines are reviewed at length with the patient today.  The patient concerns regarding his medicines were addressed.  The following changes have been made:  No change  Labs/ tests ordered today include:  No orders of the defined types were placed in this encounter.   Recommend 150 minutes/week of aerobic exercise Low fat, low carb, high fiber diet recommended  Disposition:   FU based on BP results; may be able to f/u with PMD if BP controlled.    SignedLarae Grooms, MD  10/11/2018 11:07 AM    [facility] [address], [place], [place]  [address] Phone: [phone]; Fax: [phone]

## 2018-10-11 ENCOUNTER — Ambulatory Visit (INDEPENDENT_AMBULATORY_CARE_PROVIDER_SITE_OTHER): Payer: BC Managed Care – PPO | Admitting: Pulmonary Disease

## 2018-10-11 ENCOUNTER — Encounter: Payer: Self-pay | Admitting: Interventional Cardiology

## 2018-10-11 ENCOUNTER — Encounter: Payer: Self-pay | Admitting: Pulmonary Disease

## 2018-10-11 ENCOUNTER — Other Ambulatory Visit: Payer: Self-pay

## 2018-10-11 ENCOUNTER — Ambulatory Visit (INDEPENDENT_AMBULATORY_CARE_PROVIDER_SITE_OTHER): Payer: BC Managed Care – PPO | Admitting: Interventional Cardiology

## 2018-10-11 ENCOUNTER — Telehealth: Payer: Self-pay | Admitting: Pulmonary Disease

## 2018-10-11 VITALS — BP 134/78 | HR 90 | Temp 98.3°F | Ht 72.0 in | Wt 175.2 lb

## 2018-10-11 VITALS — BP 144/96 | HR 87 | Ht 72.0 in | Wt 174.8 lb

## 2018-10-11 DIAGNOSIS — F17210 Nicotine dependence, cigarettes, uncomplicated: Secondary | ICD-10-CM

## 2018-10-11 DIAGNOSIS — R05 Cough: Secondary | ICD-10-CM | POA: Diagnosis not present

## 2018-10-11 DIAGNOSIS — Z7289 Other problems related to lifestyle: Secondary | ICD-10-CM | POA: Diagnosis not present

## 2018-10-11 DIAGNOSIS — F17200 Nicotine dependence, unspecified, uncomplicated: Secondary | ICD-10-CM | POA: Diagnosis not present

## 2018-10-11 DIAGNOSIS — Z789 Other specified health status: Secondary | ICD-10-CM

## 2018-10-11 DIAGNOSIS — R053 Chronic cough: Secondary | ICD-10-CM

## 2018-10-11 DIAGNOSIS — I10 Essential (primary) hypertension: Secondary | ICD-10-CM | POA: Diagnosis not present

## 2018-10-11 MED ORDER — CARVEDILOL 3.125 MG PO TABS: 3.1250 mg | ORAL_TABLET | Freq: Two times a day (BID) | ORAL | 3 refills | Status: DC

## 2018-10-11 MED ORDER — BENZONATATE 200 MG PO CAPS: 200.0000 mg | ORAL_CAPSULE | Freq: Three times a day (TID) | ORAL | 0 refills | Status: DC | PRN

## 2018-10-11 NOTE — Patient Instructions (Signed)
Medication Instructions:  Your physician has recommended you make the following change in your medication:   START: carvedilol 3.125 mg tablet: Take 1 tablet by mouth twice a day  Lab work: None Ordered  If you have labs (blood work) drawn today and your tests are completely normal, you will receive your results only by: Marland Kitchen MyChart Message (if you have MyChart) OR . A paper copy in the mail If you have any lab test that is abnormal or we need to change your treatment, we will call you to review the results.  Testing/Procedures: None ordered  Follow-Up: Monitor you Blood pressure and call back in 2 weeks to report. Follow up will be determined based on your readings.  Any Other Special Instructions Will Be Listed Below (If Applicable).

## 2018-10-11 NOTE — Telephone Encounter (Signed)
Patient needs next available PFT per JE please not urgent

## 2018-10-11 NOTE — Progress Notes (Signed)
Subjective:   PATIENT ID: Ralph Tapia GENDER: male DOB: 05/08/1985, MRN: [id number]   HPI  Chief Complaint  Patient presents with  . Consult    cough and blood streaked in sputum since jan 2020    Reason for Visit: New consult for cough  Ralph Tapia is a active smoker with hypertension and psoriasis on Humira who presents as a new consult for chronic cough and hemoptysis.  PCP records reviewed from [facility] on 09/08/18 and summarized as follows: Telvisit for cough x 6 months that is gradually worsening. Associated with blood tinged sputum. Lisinopril was discontinued.   He reports that he has had a chronic cough for the last 2 years however has noticed blood-tinged sputum that began 6 months ago.  His cough is unchanged in quality compared to the years prior.  It is initially a productive cough in the morning but after dark thick sputum production, cough usually improves.  Recently he has noticed that his sputum has pinkish to red streaks.  Denies blood clots.  Denies associated shortness of breath or wheezing.  Denies nocturnal coughing or shortness of breath.  He has not tried anything over-the-counter to improve the cough.  However smoking and drinking water seems to relieve it.  Until he is able to cough up the congestion, his cough will improve.  Sometimes the cough is noticed when he is laying down.  Is not able to identify any triggers.  Denies changes in quality or frequency of cough with humidity, seasons or allergy exposures.  He has occasional shortness of breath with heavy exertion but no associated cough or wheezing. He previously was on Lisinopril for his hypertension for 2-3 weeks but discontinued last year due to muscle soreness or joint pain.  In general he is a very active individual and enjoys playing golf and doing yardwork.  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  He does not work with farm [employer].  He reports relatively healthy  childhood and denies any respiratory illness.  Denies chronic allergies, reflux, chest pain, shortness of breath, wheezing, nausea abdominal pain, sour taste.  Denies weight change, unexplained fevers or chills or night sweats.  His grandmother recently was diagnosed with lung cancer and receiving palliative treatment.  Social History: Started smoking in 2005 up to 1 pack/day for 15 years. Currently smoking 1/2 ppd.  He is working on quitting.  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  Past Medical History:  Diagnosis Date  . Alcohol dependence ([place]) 01/21/2015  . Chronic tonsillitis 08/2011   snores during sleep, occ. stops breathing, and wakes up gasping/choking; has not had sleep study  . Essential hypertension 06/16/2017  . Psoriasis 12/11/2013  . Smoker   . Tobacco abuse      Family History  Problem Relation Age of Onset  . Hypertension Mother   . Hypertension Father      Social History   Occupational History  . Occupation: Works at [employer]  Tobacco Use  . Smoking status: Current Every Day Smoker    Packs/day: 1.00    Years: 15.00    Pack years: 15.00    Types: Cigarettes    Start date: 2005  . Smokeless tobacco: Never Used  Substance and Sexual Activity  . Alcohol use: Yes    Comment: 3-4 x/week  . Drug use: No  .  Sexual activity: Yes    No Known Allergies   Outpatient Medications Prior to Visit  Medication Sig Dispense Refill  . Adalimumab (HUMIRA [place]) Inject into the skin.    Marland Kitchen amLODipine (NORVASC) 10 MG tablet Take 1 tablet (10 mg total) by mouth daily. 90 tablet 1  . fluticasone (CUTIVATE) 0.05 % cream      No facility-administered medications prior to visit.     Review of Systems  Constitutional: Negative for chills, diaphoresis, fever, malaise/fatigue and weight loss.  HENT: Negative for congestion, ear pain and sore  throat.   Respiratory: Positive for cough and hemoptysis. Negative for sputum production, shortness of breath and wheezing.   Cardiovascular: Negative for chest pain, palpitations and leg swelling.  Gastrointestinal: Negative for abdominal pain, heartburn and nausea.  Genitourinary: Negative for frequency.  Musculoskeletal: Negative for joint pain and myalgias.  Skin: Negative for itching and rash.  Neurological: Negative for dizziness, weakness and headaches.  Endo/Heme/Allergies: Does not bruise/bleed easily.  Psychiatric/Behavioral: Negative for depression. The patient is not nervous/anxious.      Objective:   Vitals:   10/11/18 0905 10/11/18 0907  BP:  134/78  Pulse:  90  Temp: 98.3 F (36.8 C)   TempSrc: Temporal   SpO2:  97%  Weight: 175 lb 3.2 oz (79.5 kg)   Height: 6' (1.829 m)    SpO2: 97 % O2 Device: None (Room air)  Physical Exam: General: Well-appearing, no acute distress HENT: [place], AT, posterior pharynx cobblestoning, MMM Eyes: EOMI, no scleral icterus Respiratory: Clear to auscultation bilaterally.  No crackles, wheezing or rales Cardiovascular: RRR, -M/R/G, no JVD GI: BS+, soft, nontender Extremities:-Edema,-tenderness Neuro: AAO x4, CNII-XII grossly intact Skin: Intact, no rashes or bruising Psych: Normal mood, normal affect  Data Reviewed:  Imaging: CXR 09/09/2018- Hyperinflation, no infiltrate, edema or effusion CT abdomen 12/21/2004-lung bases clear with no evidence of parenchymal abnormality.  No effusion or edema.  Acute appendicitis with moderate free fluid in pelvis  PFT: None on file  Labs: CBC    Component Value Date/Time   WBC 7.3 06/16/2017 0919   RBC 4.75 06/16/2017 0919   HGB 16.2 06/16/2017 0919   HCT 45.1 06/16/2017 0919   PLT 272 06/16/2017 0919   MCV 94.9 06/16/2017 0919   MCH 34.1 (H) 06/16/2017 0919   MCHC 35.9 06/16/2017 0919   RDW 12.6 06/16/2017 0919   LYMPHSABS 2,840 06/16/2017 0919   MONOABS 0.5 10/05/2012 1033    EOSABS 183 06/16/2017 0919   BASOSABS 29 06/16/2017 0919    Imaging, labs and tests noted above have been reviewed independently by me.    Assessment & Plan:   Discussion: 33 year old male with psoriasis on Humira presents for chronic cough.  Differential includes undiagnosed asthma, reflux, and upper airway cough syndrome.  Etiologically is unclear so will empirically treat allergies and although his presentation is atypical for asthma, will obtain pulmonary function testing for further evaluation. Of note labs notable for peripheral eosinophilia of 183.  His hemoptysis is minimal and likely related to his chronic bronchitis.  No red flags noted for now.  Chronic cough Counseled on multiple causes of cough including smoking, asthma, reflux and UACS Ordered Pulmonary function test START zyrtec or claritin. Take as directed We will also provide prescription for tessalon perles  Advised to quit smoking  Hemoptysis: minimal blood tinged sputum, likely trauma induced from chronic cough Continue to monitor  Tobacco abuse Patient is an active smoker. He is trying to cut back We discussed  smoking cessation for 3 minutes. We discussed triggers and stressors and ways to deal with them. We discussed barriers to continued smoking and benefits of smoking cessation.   Health Maintenance Immunization History  Administered Date(s) Administered  . Hep A / Hep B 12/11/2013, 01/25/2014, 06/26/2014  . Influenza,inj,Quad PF,6+ Mos 02/07/2015  . MMR 06/16/2017  . Tdap 06/16/2017   CT Lung Screen - not qualified  Orders Placed This Encounter  Procedures  . Pulmonary function test    Standing Status:   Future    Standing Expiration Date:   10/11/2019    Scheduling Instructions:     Next available    Order Specific Question:   Where should this test be performed?    Answer:   [facility]    Order Specific Question:   Full PFT: includes the following: basic spirometry, spirometry pre & post  bronchodilator, diffusion capacity (DLCO), lung volumes    Answer:   Full PFT   Meds ordered this encounter  Medications  . benzonatate (TESSALON) 200 MG capsule    Sig: Take 1 capsule (200 mg total) by mouth 3 (three) times daily as needed for cough.    Dispense:  21 capsule    Refill:  0    Return when PFTs scheduled.  Woodruff, MD [facility] 10/11/2018 9:19 AM  Office Number [phone]

## 2018-10-11 NOTE — Patient Instructions (Addendum)
Chronic Cough We will arrange pulmonary function test START flonase 1 spray per nare once a day OR START zyrtec or claritin. Take as directed We will also provide prescription for tessalon perles   Follow -up with NP or me    Cough, Adult Coughing is a reflex that clears your throat and your airways (respiratory system). Coughing helps to heal and protect your lungs. It is normal to cough occasionally, but a cough that happens with other symptoms or lasts a long time may be a sign of a condition that needs treatment. An acute cough may only last 2-3 weeks, while a chronic cough may last 8 or more weeks. Coughing is commonly caused by:  Infection of the respiratory systemby viruses or bacteria.  Breathing in substances that irritate your lungs.  Allergies.  Asthma.  Mucus that runs down the back of your throat (postnasal drip).  Smoking.  Acid backing up from the stomach into the esophagus (gastroesophageal reflux).  Certain medicines.  Chronic lung problems.  Other medical conditions such as heart failure or a blood clot in the lung (pulmonary embolism). Follow these instructions at home: Medicines  Take over-the-counter and prescription medicines only as told by your health care provider.  Talk with your health care provider before you take a cough suppressant medicine. Lifestyle   Avoid cigarette smoke. Do not use any products that contain nicotine or tobacco, such as cigarettes, e-cigarettes, and chewing tobacco. If you need help quitting, ask your health care provider.  Drink enough fluid to keep your urine pale yellow.  Avoid caffeine.  Do not drink alcohol if your health care provider tells you not to drink. General instructions   Pay close attention to changes in your cough. Tell your health care provider about them.  Always cover your mouth when you cough.  Avoid things that make you cough, such as perfume, candles, cleaning products, or campfire or  tobacco smoke.  If the air is dry, use a cool mist vaporizer or humidifier in your bedroom or your home to help loosen secretions.  If your cough is worse at night, try to sleep in a semi-upright position.  Rest as needed.  Keep all follow-up visits as told by your health care provider. This is important. Contact a health care provider if you:  Have new symptoms.  Cough up pus.  Have a cough that does not get better after 2-3 weeks or gets worse.  Cannot control your cough with cough suppressant medicines and you are losing sleep.  Have pain that gets worse or pain that is not helped with medicine.  Have a fever.  Have unexplained weight loss.  Have night sweats. Get help right away if:  You cough up blood.  You have difficulty breathing.  Your heartbeat is very fast. These symptoms may represent a serious problem that is an emergency. Do not wait to see if the symptoms will go away. Get medical help right away. Call your local emergency services (911 in the [place] Do not drive yourself to the hospital. Summary  Coughing is a reflex that clears your throat and your airways. It is normal to cough occasionally, but a cough that happens with other symptoms or lasts a long time may be a sign of a condition that needs treatment.  Take over-the-counter and prescription medicines only as told by your health care provider.  Always cover your mouth when you cough.  Contact a health care provider if you have new symptoms or a  cough that does not get better after 2-3 weeks or gets worse. This information is not intended to replace advice given to you by your health care provider. Make sure you discuss any questions you have with your health care provider. Document Released: 07/25/2010 Document Revised: 02/14/2018 Document Reviewed: 02/14/2018 Elsevier Patient Education  [place].

## 2018-10-13 NOTE — Telephone Encounter (Signed)
Scheduled pft for 10/31/2018-pr  °

## 2018-10-19 ENCOUNTER — Ambulatory Visit: Payer: BC Managed Care – PPO | Admitting: Podiatry

## 2018-10-20 ENCOUNTER — Ambulatory Visit: Payer: BC Managed Care – PPO | Admitting: Podiatry

## 2018-10-20 ENCOUNTER — Other Ambulatory Visit: Payer: BC Managed Care – PPO | Admitting: Orthotics

## 2018-10-24 ENCOUNTER — Other Ambulatory Visit: Payer: Self-pay | Admitting: Pulmonary Disease

## 2018-10-27 ENCOUNTER — Encounter: Payer: Self-pay | Admitting: Podiatry

## 2018-10-27 ENCOUNTER — Other Ambulatory Visit: Payer: Self-pay

## 2018-10-27 ENCOUNTER — Ambulatory Visit: Payer: BC Managed Care – PPO | Admitting: Orthotics

## 2018-10-27 ENCOUNTER — Ambulatory Visit (INDEPENDENT_AMBULATORY_CARE_PROVIDER_SITE_OTHER): Payer: BC Managed Care – PPO | Admitting: Podiatry

## 2018-10-27 DIAGNOSIS — M779 Enthesopathy, unspecified: Secondary | ICD-10-CM

## 2018-10-27 DIAGNOSIS — M205X1 Other deformities of toe(s) (acquired), right foot: Secondary | ICD-10-CM

## 2018-10-27 NOTE — Progress Notes (Signed)
Patient came in today to pick up custom made foot orthotics.  The goals were accomplished and the patient reported no dissatisfaction with said orthotics.  Patient was advised of breakin period and how to report any issues. 

## 2018-10-27 NOTE — Patient Instructions (Signed)

## 2018-10-28 ENCOUNTER — Other Ambulatory Visit (HOSPITAL_COMMUNITY)
Admission: RE | Admit: 2018-10-28 | Discharge: 2018-10-28 | Disposition: A | Discharge status: Home / Self Care | Payer: BC Managed Care – PPO | Source: Ambulatory Visit | Attending: Pulmonary Disease | Admitting: Pulmonary Disease

## 2018-10-28 DIAGNOSIS — Z01812 Encounter for preprocedural laboratory examination: Secondary | ICD-10-CM | POA: Insufficient documentation

## 2018-10-28 DIAGNOSIS — Z20828 Contact with and (suspected) exposure to other viral communicable diseases: Secondary | ICD-10-CM | POA: Insufficient documentation

## 2018-10-28 LAB — SARS CORONAVIRUS 2 (TAT 6-24 HRS): SARS Coronavirus 2: NEGATIVE

## 2018-10-31 ENCOUNTER — Ambulatory Visit (INDEPENDENT_AMBULATORY_CARE_PROVIDER_SITE_OTHER): Payer: BC Managed Care – PPO | Admitting: Pulmonary Disease

## 2018-10-31 ENCOUNTER — Other Ambulatory Visit: Payer: Self-pay

## 2018-10-31 ENCOUNTER — Encounter: Payer: Self-pay | Admitting: Pulmonary Disease

## 2018-10-31 VITALS — BP 130/68 | HR 81 | Temp 98.0°F | Ht 72.0 in | Wt 170.0 lb

## 2018-10-31 DIAGNOSIS — R053 Chronic cough: Secondary | ICD-10-CM

## 2018-10-31 DIAGNOSIS — R05 Cough: Secondary | ICD-10-CM | POA: Diagnosis not present

## 2018-10-31 DIAGNOSIS — R042 Hemoptysis: Secondary | ICD-10-CM

## 2018-10-31 DIAGNOSIS — Z720 Tobacco use: Secondary | ICD-10-CM

## 2018-10-31 LAB — PULMONARY FUNCTION TEST
DL/VA % pred: 108 %
DL/VA: 5.22 ml/min/mmHg/L
DLCO unc % pred: 97 %
DLCO unc: 33.66 ml/min/mmHg
FEF 25-75 Post: 4.06 L/sec
FEF 25-75 Pre: 4.29 L/sec
FEF2575-%Change-Post: -5 %
FEF2575-%Pred-Post: 89 %
FEF2575-%Pred-Pre: 94 %
FEV1-%Change-Post: -1 %
FEV1-%Pred-Post: 92 %
FEV1-%Pred-Pre: 93 %
FEV1-Post: 4.33 L
FEV1-Pre: 4.39 L
FEV1FVC-%Change-Post: 0 %
FEV1FVC-%Pred-Pre: 98 %
FEV6-%Change-Post: -1 %
FEV6-%Pred-Post: 94 %
FEV6-%Pred-Pre: 96 %
FEV6-Post: 5.38 L
FEV6-Pre: 5.48 L
FEV6FVC-%Pred-Post: 101 %
FEV6FVC-%Pred-Pre: 101 %
FVC-%Change-Post: -1 %
FVC-%Pred-Post: 92 %
FVC-%Pred-Pre: 94 %
FVC-Post: 5.38 L
FVC-Pre: 5.48 L
Post FEV1/FVC ratio: 81 %
Post FEV6/FVC ratio: 100 %
Pre FEV1/FVC ratio: 80 %
Pre FEV6/FVC Ratio: 100 %
RV % pred: 126 %
RV: 2.25 L
TLC % pred: 102 %
TLC: 7.46 L

## 2018-10-31 MED ORDER — PREDNISONE 20 MG PO TABS: 40.0000 mg | ORAL_TABLET | Freq: Every day | ORAL | 0 refills | Status: DC

## 2018-10-31 NOTE — Patient Instructions (Addendum)
Hemoptysis CT Chest without contrast Pending results, we may consider bronchoscopy. If normal, no further work-up indicated  Chronic Bronchitis Prednisone 40 mg x 5 days  Tobacco Abuse Discussed quitting smoking.    Tobacco Use Disorder Tobacco use disorder (TUD) occurs when a person craves, seeks, and uses tobacco, regardless of the consequences. This disorder can cause problems with mental and physical health. It can affect your ability to have healthy relationships, and it can keep you from meeting your responsibilities at work, home, or school. Tobacco may be:  Smoked as a cigarette or cigar.  Inhaled using e-cigarettes.  Smoked in a pipe or hookah.  Chewed as smokeless tobacco.  Inhaled into the nostrils as snuff. Tobacco products contain a dangerous chemical called nicotine, which is very addictive. Nicotine triggers hormones that make the body feel stimulated and works on areas of the brain that make you feel good. These effects can make it hard for people to quit nicotine. Tobacco contains many other unsafe chemicals that can damage almost every organ in the body. Smoking tobacco also puts others in danger due to fire risk and possible health problems caused by breathing in secondhand smoke. What are the signs or symptoms? Symptoms of TUD may include:  Being unable to slow down or stop your tobacco use.  Spending an abnormal amount of time getting or using tobacco.  Craving tobacco. Cravings may last for up to 6 months after quitting.  Tobacco use that: ? Interferes with your work, school, or home life. ? Interferes with your personal and social relationships. ? Makes you give up activities that you once enjoyed or found important.  Using tobacco even though you know that it is: ? Dangerous or bad for your health or someone else's health. ? Causing problems in your life.  Needing more and more of the substance to get the same effect (developing  tolerance).  Experiencing unpleasant symptoms if you do not use the substance (withdrawal). Withdrawal symptoms may include: ? Depressed, anxious, or irritable mood. ? Difficulty concentrating. ? Increased appetite. ? Restlessness or trouble sleeping.  Using the substance to avoid withdrawal. How is this diagnosed? This condition may be diagnosed based on:  Your current and past tobacco use. Your health care provider may ask questions about how your tobacco use affects your life.  A physical exam. You may be diagnosed with TUD if you have at least two symptoms within a 11-month period. How is this treated? This condition is treated by stopping tobacco use. Many people are unable to quit on their own and need help. Treatment may include:  Nicotine replacement therapy (NRT). NRT provides nicotine without the other harmful chemicals in tobacco. NRT gradually lowers the dosage of nicotine in the body and reduces withdrawal symptoms. NRT is available as: ? Over-the-counter gums, lozenges, and skin patches. ? Prescription mouth inhalers and nasal sprays.  Medicine that acts on the brain to reduce cravings and withdrawal symptoms.  A type of talk therapy that examines your triggers for tobacco use, how to avoid them, and how to cope with cravings (behavioral therapy).  Hypnosis. This may help with withdrawal symptoms.  Joining a support group for others coping with TUD. The best treatment for TUD is usually a combination of medicine, talk therapy, and support groups. Recovery can be a long process. Many people start using tobacco again after stopping (relapse). If you relapse, it does not mean that treatment will not work. Follow these instructions at home:  Lifestyle  Do  not use any products that contain nicotine or tobacco, such as cigarettes and e-cigarettes.  Avoid things that trigger tobacco use as much as you can. Triggers include people and situations that usually cause you to  use tobacco.  Avoid drinks that contain caffeine, including coffee. These may worsen some withdrawal symptoms.  Find ways to manage stress. Wanting to smoke may cause stress, and stress can make you want to smoke. Relaxation techniques such as deep breathing, meditation, and yoga may help.  Attend support groups as needed. These groups are an important part of long-term recovery for many people. General instructions  Take over-the-counter and prescription medicines only as told by your health care provider.  Check with your health care provider before taking any new prescription or over-the-counter medicines.  Decide on a friend, family member, or smoking quit-line (such as 1-800-QUIT-NOW in the [place] that you can call or text when you feel the urge to smoke or when you need help coping with cravings.  Keep all follow-up visits as told by your health care provider and therapist. This is important. Contact a health care provider if:  You are not able to take your medicines as prescribed.  Your symptoms get worse, even with treatment. Summary  Tobacco use disorder (TUD) occurs when a person craves, seeks, and uses tobacco regardless of the consequences.  This condition may be diagnosed based on your current and past tobacco use and a physical exam.  Many people are unable to quit on their own and need help. Recovery can be a long process.  The most effective treatment for TUD is usually a combination of medicine, talk therapy, and support groups. This information is not intended to replace advice given to you by your health care provider. Make sure you discuss any questions you have with your health care provider. Document Released: 10/02/2003 Document Revised: 01/13/2017 Document Reviewed: 01/13/2017 Elsevier Patient Education  2020 [employer].

## 2018-10-31 NOTE — Progress Notes (Signed)
Full PFT performed today. °

## 2018-10-31 NOTE — Progress Notes (Signed)
Subjective:   Patient ID: Ralph Tapia, male   DOB: 33 y.o.   MRN: [id number]   HPI Patient states he is still having pain in the joint and he just picked up his orthotics and he would like a new injection   ROS      Objective:  Physical Exam  Neurovascular status intact with patient's right first MPJ continuing to be inflamed and patient has orthotics with reverse Morton's extension that fit well.  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  Reappoint for [place] to recheck again in the next 4 to 6 weeks or earlier if needed

## 2018-10-31 NOTE — Progress Notes (Signed)
Subjective:   PATIENT ID: Ralph Tapia GENDER: male DOB: 1985/06/09, MRN: [id number]   HPI  Chief Complaint  Patient presents with  . Follow-up    pft   Reason for Visit: F/u for PFTs  Mr. Ralph Tapia is a 33 year old male active smoker with HTN and psoriasis on Humira who presents as follow-up for chronic cough.  He was initially referred for 2 year hx of chronic cough with recent blood-tinged sputum in the last 6 months. His symptoms remain unchanged. 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  Social History: Started smoking in 2005 up to 1 pack/day for 15 years. Currently smoking 1/2 ppd.  He is working on quitting. His grandmother recently was diagnosed with lung cancer and receiving palliative treatment.  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  Past Medical History:  Diagnosis Date  . Alcohol dependence ([place]) 01/21/2015  . Chronic tonsillitis 08/2011   snores during sleep, occ. stops breathing, and wakes up gasping/choking; has not had sleep study  . Essential hypertension 06/16/2017  . Psoriasis 12/11/2013  . Smoker   . Tobacco abuse      Family History  Problem Relation Age of Onset  . Hypertension Mother   . Hypertension Father      Social History   Occupational History  . Occupation: Works at [employer]  Tobacco Use  . Smoking status: Current Every Day Smoker    Packs/day: 1.00    Years: 15.00    Pack years: 15.00    Types: Cigarettes    Start date: 2005  . Smokeless tobacco: Never Used  Substance and Sexual Activity  . Alcohol use: Yes    Comment: 3-4 x/week  . Drug use: No  . Sexual activity: Yes    No Known Allergies   Outpatient Medications Prior to Visit  Medication Sig Dispense Refill  . Adalimumab (HUMIRA [place]) Inject into the skin.    Marland Kitchen amLODipine (NORVASC) 10 MG tablet Take 1 tablet (10 mg total) by mouth  daily. 90 tablet 1  . benzonatate (TESSALON) 200 MG capsule Take 1 capsule (200 mg total) by mouth 3 (three) times daily as needed for cough. 21 capsule 0  . carvedilol (COREG) 3.125 MG tablet Take 1 tablet (3.125 mg total) by mouth 2 (two) times daily. 180 tablet 3  . fluticasone (CUTIVATE) 0.05 % cream      No facility-administered medications prior to visit.     Review of Systems  Constitutional: Negative for chills, fever, malaise/fatigue and weight loss.  HENT: Negative for congestion.   Respiratory: Positive for cough, hemoptysis and sputum production. Negative for shortness of breath and wheezing.   Cardiovascular: Negative for chest pain, palpitations and leg swelling.     Objective:   Vitals:   10/31/18 1517  BP: 130/68  Pulse: 81  Temp: 98 F (36.7 C)  TempSrc: Temporal  SpO2: 98%  Weight: 170 lb (77.1 kg)  Height: 6' (1.829 m)     Physical Exam: General: Well-appearing, no acute distress HENT: [place], AT Eyes: EOMI, no scleral icterus Respiratory: Clear to auscultation bilaterally.  No crackles, wheezing or rales Cardiovascular: RRR, -M/R/G, no JVD GI: BS+, soft, nontender Extremities:-Edema,-tenderness Neuro: AAO x4, CNII-XII grossly intact Skin: Intact, no rashes or bruising Psych: Normal mood, normal affect  Data Reviewed:  Imaging: CXR 09/09/2018- Hyperinflation, no infiltrate, edema or effusion CT abdomen 12/21/2004-lung bases clear with no  evidence of parenchymal abnormality.  No effusion or edema.  Acute appendicitis with moderate free fluid in pelvis  PFT: 10/31/18 FVC 5.38 (92%) FEV1 4.33 (92%) Ratio 80  TLC 102% DLCO 97% Interpretation: Normal PFTs.  Labs: CBC    Component Value Date/Time   WBC 7.3 06/16/2017 0919   RBC 4.75 06/16/2017 0919   HGB 16.2 06/16/2017 0919   HCT 45.1 06/16/2017 0919   PLT 272 06/16/2017 0919   MCV 94.9 06/16/2017 0919   MCH 34.1 (H) 06/16/2017 0919   MCHC 35.9 06/16/2017 0919   RDW 12.6 06/16/2017 0919    LYMPHSABS 2,840 06/16/2017 0919   MONOABS 0.5 10/05/2012 1033   EOSABS 183 06/16/2017 0919   BASOSABS 29 06/16/2017 0919    Imaging, labs and tests noted above have been reviewed independently by me.    Assessment & Plan:   Discussion: 33 year old male with psoriasis on Humira who presents for chronic cough. PFTs reviewed and demonstrated no evidence of obstructive and restrictive defect. Continues to have small volume blood streaked sputum/hemoptysis. Will treat with steroids to see if this resolves and further evaluate with chest imaging.   Chronic bronchitis/cough Counseled on multiple causes of cough including smoking, reflux and UACS Consider daily allergy pill (zyrtec, claritin, allegra) Advised to quit smoking START prednisone 40 mg x 5 days  Hemoptysis: minimal blood tinged sputum, likely trauma induced from chronic cough CT Chest without contrast  Tobacco abuse Patient is an active smoker. We discussed smoking cessation for 5 minutes. We discussed triggers and stressors and ways to deal with them. We discussed barriers to continued smoking and benefits of smoking cessation. Provided patient with information cessation techniques and interventions including [place] quitline. Marland Kitchen   Health Maintenance Immunization History  Administered Date(s) Administered  . Hep A / Hep B 12/11/2013, 01/25/2014, 06/26/2014  . Influenza,inj,Quad PF,6+ Mos 02/07/2015  . MMR 06/16/2017  . Tdap 06/16/2017   CT Lung Screen - not qualified  Orders Placed This Encounter  Procedures  . CT Chest High Resolution    Please schedule 3-4 weeks prior to next visit SS. Judeen Hammans [phone]/ BCBS     Standing Status:   Future    Standing Expiration Date:   12/31/2019    Order Specific Question:   Preferred imaging location?    Answer:   [facility]    Order Specific Question:   Radiology Contrast Protocol - do NOT remove file path    Answer:   \\[email or web address]   Meds  ordered this encounter  Medications  . predniSONE (DELTASONE) 20 MG tablet    Sig: Take 2 tablets (40 mg total) by mouth daily with breakfast.    Dispense:  10 tablet    Refill:  0    Return After CT Chest.  Greater than 50% of this patient 25-minute office visit was spent face-to-face in counseling with the patient/family. We discussed medical diagnosis and treatment plan as noted.  [place], MD [facility] 10/31/2018 8:56 AM  Office Number [phone]

## 2018-11-03 DIAGNOSIS — L400 Psoriasis vulgaris: Secondary | ICD-10-CM | POA: Diagnosis not present

## 2018-11-03 DIAGNOSIS — K130 Diseases of lips: Secondary | ICD-10-CM | POA: Diagnosis not present

## 2018-11-08 DIAGNOSIS — Z720 Tobacco use: Secondary | ICD-10-CM | POA: Diagnosis not present

## 2018-11-08 DIAGNOSIS — I10 Essential (primary) hypertension: Secondary | ICD-10-CM | POA: Diagnosis not present

## 2018-11-08 DIAGNOSIS — L309 Dermatitis, unspecified: Secondary | ICD-10-CM | POA: Diagnosis not present

## 2018-11-08 DIAGNOSIS — L409 Psoriasis, unspecified: Secondary | ICD-10-CM | POA: Diagnosis not present

## 2018-11-08 DIAGNOSIS — R042 Hemoptysis: Secondary | ICD-10-CM | POA: Insufficient documentation

## 2018-11-22 ENCOUNTER — Other Ambulatory Visit: Payer: Self-pay

## 2018-11-22 ENCOUNTER — Ambulatory Visit (INDEPENDENT_AMBULATORY_CARE_PROVIDER_SITE_OTHER)
Admission: RE | Admit: 2018-11-22 | Discharge: 2018-11-22 | Disposition: A | Discharge status: Home / Self Care | Payer: BC Managed Care – PPO | Source: Ambulatory Visit | Attending: Pulmonary Disease | Admitting: Pulmonary Disease

## 2018-11-22 DIAGNOSIS — R042 Hemoptysis: Secondary | ICD-10-CM | POA: Diagnosis not present

## 2018-11-22 DIAGNOSIS — R918 Other nonspecific abnormal finding of lung field: Secondary | ICD-10-CM | POA: Diagnosis not present

## 2018-11-23 NOTE — Progress Notes (Signed)
Pt informed:Normal lungs and lymph nodes. There is one tiny lung spot however it is <46mm and is likely benign.  With normal CT and breathing tests, I do not recommend further testing at this time. Please call our office if you develop any recurrent or worsening symptoms.   Pt verbalized understanding  Nothing further needed.

## 2018-12-08 ENCOUNTER — Other Ambulatory Visit: Payer: Self-pay

## 2018-12-08 ENCOUNTER — Encounter: Payer: Self-pay | Admitting: Podiatry

## 2018-12-08 ENCOUNTER — Ambulatory Visit (INDEPENDENT_AMBULATORY_CARE_PROVIDER_SITE_OTHER): Payer: BC Managed Care – PPO | Admitting: Podiatry

## 2018-12-08 DIAGNOSIS — M205X1 Other deformities of toe(s) (acquired), right foot: Secondary | ICD-10-CM | POA: Diagnosis not present

## 2018-12-08 NOTE — Progress Notes (Signed)
Subjective:   Patient ID: Ralph Tapia, male   DOB: 33 y.o.   MRN: [id number]   HPI Patient presents stating that the orthotic has helped him some but he knows he needs surgery on this foot.   ROS      Objective:  Physical Exam  Neurovascular status intact with continued significant range of motion loss first MPJ right with continued discomfort when I pressed around the first MPJ with pain     Assessment:  Hallux limitus rigidus condition right that continues to be persistent with orthotics that have helped him to a marginal degree     Plan:  Reviewed condition and we discussed treatment options and he states he would rather try to get to earlier versus later as he would like to be able to save his cartilage.  I did explain there is no guarantee of this but I am hopeful that by plantar flexing and removing spurring that we can get him more motion and improve his range of motion in way of life.  He is willing to undergo this and is scheduled for the end of December and will reappoint mid December for final consultation and boot application

## 2019-01-18 ENCOUNTER — Telehealth: Payer: Self-pay | Admitting: Podiatry

## 2019-01-18 NOTE — Telephone Encounter (Addendum)
DOS: 02/07/2019  SURGICAL PROCEDURE: [facility] [place] Bi-Planar SEGBT([id number])  BCBS Policy Effective : 60/73/7106  -  02/09/2019   Member Liability Summary       In-Network   Max Per Benefit Period Year-to-Date Remaining     CoInsurance         Deductible $2,800.00             $0.00     Out-Of-Pocket 3 $7,350.00 $2,346.00 3 Out-of-Pocket includes copay, deductible, and coinsurance.  Hospital - [facility]      In-Network Copay Coinsurance Authorization Required Not Applicable 26%  Yes  Per Jenna L no prior authorization required. Call ref# [id number].

## 2019-01-25 ENCOUNTER — Other Ambulatory Visit: Payer: Self-pay

## 2019-01-25 ENCOUNTER — Encounter: Payer: Self-pay | Admitting: Podiatry

## 2019-01-25 ENCOUNTER — Ambulatory Visit (INDEPENDENT_AMBULATORY_CARE_PROVIDER_SITE_OTHER): Payer: BC Managed Care – PPO | Admitting: Podiatry

## 2019-01-25 DIAGNOSIS — M205X1 Other deformities of toe(s) (acquired), right foot: Secondary | ICD-10-CM | POA: Diagnosis not present

## 2019-01-25 NOTE — Progress Notes (Signed)
Subjective:   Patient ID: Ralph Tapia, male   DOB: 33 y.o.   MRN: [id number]   HPI Patient presents with chronic deformity of the big toe joint right stating it is been increasingly sore and making it hard to walk or work comfortably   ROS      Objective:  Physical Exam  Neurovascular status intact with enlargement around the first metatarsal head right with inflammation around the joint reduced motion no crepitus     Assessment:  Hallux limitus condition right with elevation elongated segment with possibility for mild arthritis of the joint     Plan:  H&P reviewed conditions and recommended biplanar osteotomy with shortening and plantar flexion of the metatarsal head.  I reviewed consent form with patient going over alternative treatments complications and everything else is outlined and patient wants surgery and after extensive review signs of understanding if there is a cartilage damage will not be able to repair it and that ultimately could require fusion or joint implantation.  Willing to accept risk and today I did go ahead and dispensed air fracture walker with instructions on usage and patient does understand total recovery will be 6 months to 1 year with surgery

## 2019-01-25 NOTE — Patient Instructions (Signed)
Pre-Operative Instructions  Congratulations, you have decided to take an important step towards improving your quality of life.  You can be assured that the doctors and staff at [facility] will be with you every step of the way.  Here are some important things you should know:  1. Plan to be at the surgery center/hospital at least 1 (one) hour prior to your scheduled time, unless otherwise directed by the surgical center/hospital staff.  You must have a responsible adult accompany you, remain during the surgery and drive you home.  Make sure you have directions to the surgical center/hospital to ensure you arrive on time. 2. If you are having surgery at [facility] or [facility], you will need a copy of your medical history and physical form from your family physician within one month prior to the date of surgery. We will give you a form for your primary physician to complete.  3. We make every effort to accommodate the date you request for surgery.  However, there are times where surgery dates or times have to be moved.  We will contact you as soon as possible if a change in schedule is required.   4. No aspirin/ibuprofen for one week before surgery.  If you are on aspirin, any non-steroidal anti-inflammatory medications (Mobic, Aleve, Ibuprofen) should not be taken seven (7) days prior to your surgery.  You make take Tylenol for pain prior to surgery.  5. Medications - If you are taking daily heart and blood pressure medications, seizure, reflux, allergy, asthma, anxiety, pain or diabetes medications, make sure you notify the surgery center/hospital before the day of surgery so they can tell you which medications you should take or avoid the day of surgery. 6. No food or drink after midnight the night before surgery unless directed otherwise by surgical center/hospital staff. 7. No alcoholic beverages 24-hours prior to surgery.  No smoking 24-hours prior or 24-hours after  surgery. 8. Wear loose pants or shorts. They should be loose enough to fit over bandages, boots, and casts. 9. Don't wear slip-on shoes. Sneakers are preferred. 10. Bring your boot with you to the surgery center/hospital.  Also bring crutches or a walker if your physician has prescribed it for you.  If you do not have this equipment, it will be provided for you after surgery. 11. If you have not been contacted by the surgery center/hospital by the day before your surgery, call to confirm the date and time of your surgery. 12. Leave-time from work may vary depending on the type of surgery you have.  Appropriate arrangements should be made prior to surgery with your employer. 13. Prescriptions will be provided immediately following surgery by your doctor.  Fill these as soon as possible after surgery and take the medication as directed. Pain medications will not be refilled on weekends and must be approved by the doctor. 14. Remove nail polish on the operative foot and avoid getting pedicures prior to surgery. 15. Wash the night before surgery.  The night before surgery wash the foot and leg well with water and the antibacterial soap provided. Be sure to pay special attention to beneath the toenails and in between the toes.  Wash for at least three (3) minutes. Rinse thoroughly with water and dry well with a towel.  Perform this wash unless told not to do so by your physician.  Enclosed: 1 Ice pack (please put in freezer the night before surgery)   1 Hibiclens skin cleaner     Pre-op instructions  If you have any questions regarding the instructions, please do not hesitate to call our office.  [place]: [address], [place], [place] [address] -- [phone]  [place]: [address] , [place] [address] -- [phone]  [place]: [address], [place], [place] [address] -- [phone]   Website: [email or web address] 

## 2019-02-06 MED ORDER — OXYCODONE-ACETAMINOPHEN 10-325 MG PO TABS: 1.0000 | ORAL_TABLET | ORAL | 0 refills | Status: DC | PRN

## 2019-02-06 MED ORDER — ONDANSETRON HCL 4 MG PO TABS: 4.0000 mg | ORAL_TABLET | Freq: Three times a day (TID) | ORAL | 0 refills | Status: DC | PRN

## 2019-02-06 NOTE — Addendum Note (Signed)
Addended by: Wallene Huh on: 02/06/2019 05:38 PM   Modules accepted: Orders

## 2019-02-07 ENCOUNTER — Encounter: Payer: Self-pay | Admitting: Podiatry

## 2019-02-07 DIAGNOSIS — M2021 Hallux rigidus, right foot: Secondary | ICD-10-CM | POA: Diagnosis not present

## 2019-02-07 DIAGNOSIS — M205X1 Other deformities of toe(s) (acquired), right foot: Secondary | ICD-10-CM | POA: Diagnosis not present

## 2019-02-07 DIAGNOSIS — M2011 Hallux valgus (acquired), right foot: Secondary | ICD-10-CM | POA: Diagnosis not present

## 2019-02-07 DIAGNOSIS — I10 Essential (primary) hypertension: Secondary | ICD-10-CM | POA: Diagnosis not present

## 2019-02-15 ENCOUNTER — Ambulatory Visit (INDEPENDENT_AMBULATORY_CARE_PROVIDER_SITE_OTHER): Payer: BC Managed Care – PPO

## 2019-02-15 ENCOUNTER — Encounter: Payer: Self-pay | Admitting: Podiatry

## 2019-02-15 ENCOUNTER — Other Ambulatory Visit: Payer: Self-pay

## 2019-02-15 ENCOUNTER — Ambulatory Visit (INDEPENDENT_AMBULATORY_CARE_PROVIDER_SITE_OTHER): Payer: BC Managed Care – PPO | Admitting: Podiatry

## 2019-02-15 VITALS — BP 134/86 | HR 78 | Temp 97.5°F

## 2019-02-15 DIAGNOSIS — M205X1 Other deformities of toe(s) (acquired), right foot: Secondary | ICD-10-CM

## 2019-02-15 DIAGNOSIS — Z9889 Other specified postprocedural states: Secondary | ICD-10-CM

## 2019-02-15 NOTE — Progress Notes (Signed)
Subjective:   Patient ID: Ralph Tapia, male   DOB: 34 y.o.   MRN: [id number]   HPI Patient presents stating he is improved and states while it still sore he is back to work and wearing his boot   ROS      Objective:  Physical Exam  Neurovascular status intact negative Denna Haggard' sign noted wound edges well coapted with good shortening plantar flexion of the first metatarsal fixation in place joint open currently     Assessment:  Doing well post osteotomy right first metatarsal for hallux limitus rigidus condition     Plan:  H&P reviewed x-rays and discussed range of motion and the importance.  Gave instructions on exercises begin wearing surgical shoe which was dispensed and I am referring for physical therapy to try to improve motion and a rapid way.  Patient will be seen back to recheck again in 2 weeks and compression dressing reapplied  X-rays indicate osteotomies healing well fixation in place with joint congruence

## 2019-02-24 DIAGNOSIS — M629 Disorder of muscle, unspecified: Secondary | ICD-10-CM | POA: Diagnosis not present

## 2019-02-24 DIAGNOSIS — M25571 Pain in right ankle and joints of right foot: Secondary | ICD-10-CM | POA: Diagnosis not present

## 2019-02-24 DIAGNOSIS — R262 Difficulty in walking, not elsewhere classified: Secondary | ICD-10-CM | POA: Diagnosis not present

## 2019-02-24 DIAGNOSIS — M25674 Stiffness of right foot, not elsewhere classified: Secondary | ICD-10-CM | POA: Diagnosis not present

## 2019-03-01 ENCOUNTER — Encounter: Payer: BC Managed Care – PPO | Admitting: Podiatry

## 2019-03-02 ENCOUNTER — Ambulatory Visit (INDEPENDENT_AMBULATORY_CARE_PROVIDER_SITE_OTHER): Payer: BC Managed Care – PPO

## 2019-03-02 ENCOUNTER — Encounter: Payer: Self-pay | Admitting: Podiatry

## 2019-03-02 ENCOUNTER — Other Ambulatory Visit: Payer: Self-pay

## 2019-03-02 ENCOUNTER — Ambulatory Visit (INDEPENDENT_AMBULATORY_CARE_PROVIDER_SITE_OTHER): Payer: BC Managed Care – PPO | Admitting: Podiatry

## 2019-03-02 VITALS — BP 135/91 | HR 79 | Temp 97.7°F

## 2019-03-02 DIAGNOSIS — M205X1 Other deformities of toe(s) (acquired), right foot: Secondary | ICD-10-CM

## 2019-03-02 MED ORDER — DICLOFENAC SODIUM 75 MG PO TBEC: 75.0000 mg | DELAYED_RELEASE_TABLET | Freq: Two times a day (BID) | ORAL | 2 refills | Status: DC

## 2019-03-02 NOTE — Progress Notes (Signed)
Subjective:   Patient ID: Ralph Tapia, male   DOB: 34 y.o.   MRN: [id number]   HPI Patient states doing well working and is on his foot all day and knows that is why his foot is swelling neuro   ROS      Objective:  Physical Exam  Vascular status intact negative Denna Haggard' sign noted wound edges well coapted first metatarsal good alignment noted with no crepitus of the joint and reasonable range of motion     Assessment:  Overall doing well post hallux limitus repair right     Plan:  Reviewed the importance of physical therapy anti-inflammatories and support and gradual return to soft shoe gear over the next 2 weeks  X-rays indicate osteotomies healing well joint congruence fixation in place good alignment

## 2019-03-15 ENCOUNTER — Encounter: Payer: BC Managed Care – PPO | Admitting: Podiatry

## 2019-03-16 DIAGNOSIS — R262 Difficulty in walking, not elsewhere classified: Secondary | ICD-10-CM | POA: Diagnosis not present

## 2019-03-16 DIAGNOSIS — M629 Disorder of muscle, unspecified: Secondary | ICD-10-CM | POA: Diagnosis not present

## 2019-03-16 DIAGNOSIS — M25571 Pain in right ankle and joints of right foot: Secondary | ICD-10-CM | POA: Diagnosis not present

## 2019-03-16 DIAGNOSIS — M25674 Stiffness of right foot, not elsewhere classified: Secondary | ICD-10-CM | POA: Diagnosis not present

## 2019-03-23 DIAGNOSIS — M25571 Pain in right ankle and joints of right foot: Secondary | ICD-10-CM | POA: Diagnosis not present

## 2019-03-23 DIAGNOSIS — R262 Difficulty in walking, not elsewhere classified: Secondary | ICD-10-CM | POA: Diagnosis not present

## 2019-03-23 DIAGNOSIS — M25674 Stiffness of right foot, not elsewhere classified: Secondary | ICD-10-CM | POA: Diagnosis not present

## 2019-03-23 DIAGNOSIS — M629 Disorder of muscle, unspecified: Secondary | ICD-10-CM | POA: Diagnosis not present

## 2019-03-24 DIAGNOSIS — M629 Disorder of muscle, unspecified: Secondary | ICD-10-CM | POA: Diagnosis not present

## 2019-03-24 DIAGNOSIS — R262 Difficulty in walking, not elsewhere classified: Secondary | ICD-10-CM | POA: Diagnosis not present

## 2019-03-24 DIAGNOSIS — M25571 Pain in right ankle and joints of right foot: Secondary | ICD-10-CM | POA: Diagnosis not present

## 2019-03-24 DIAGNOSIS — M25674 Stiffness of right foot, not elsewhere classified: Secondary | ICD-10-CM | POA: Diagnosis not present

## 2019-03-28 DIAGNOSIS — M25571 Pain in right ankle and joints of right foot: Secondary | ICD-10-CM | POA: Diagnosis not present

## 2019-03-28 DIAGNOSIS — M25674 Stiffness of right foot, not elsewhere classified: Secondary | ICD-10-CM | POA: Diagnosis not present

## 2019-03-28 DIAGNOSIS — R262 Difficulty in walking, not elsewhere classified: Secondary | ICD-10-CM | POA: Diagnosis not present

## 2019-03-28 DIAGNOSIS — M629 Disorder of muscle, unspecified: Secondary | ICD-10-CM | POA: Diagnosis not present

## 2019-03-30 ENCOUNTER — Encounter: Payer: BC Managed Care – PPO | Admitting: Podiatry

## 2019-04-03 DIAGNOSIS — M25674 Stiffness of right foot, not elsewhere classified: Secondary | ICD-10-CM | POA: Diagnosis not present

## 2019-04-03 DIAGNOSIS — M25571 Pain in right ankle and joints of right foot: Secondary | ICD-10-CM | POA: Diagnosis not present

## 2019-04-03 DIAGNOSIS — R262 Difficulty in walking, not elsewhere classified: Secondary | ICD-10-CM | POA: Diagnosis not present

## 2019-04-03 DIAGNOSIS — M629 Disorder of muscle, unspecified: Secondary | ICD-10-CM | POA: Diagnosis not present

## 2019-04-10 ENCOUNTER — Other Ambulatory Visit: Payer: Self-pay

## 2019-04-10 ENCOUNTER — Ambulatory Visit (INDEPENDENT_AMBULATORY_CARE_PROVIDER_SITE_OTHER): Payer: BC Managed Care – PPO

## 2019-04-10 ENCOUNTER — Encounter: Payer: Self-pay | Admitting: Podiatry

## 2019-04-10 ENCOUNTER — Ambulatory Visit (INDEPENDENT_AMBULATORY_CARE_PROVIDER_SITE_OTHER): Payer: BC Managed Care – PPO | Admitting: Podiatry

## 2019-04-10 ENCOUNTER — Other Ambulatory Visit: Payer: Self-pay | Admitting: Podiatry

## 2019-04-10 DIAGNOSIS — M205X1 Other deformities of toe(s) (acquired), right foot: Secondary | ICD-10-CM

## 2019-04-10 DIAGNOSIS — Z9889 Other specified postprocedural states: Secondary | ICD-10-CM

## 2019-04-10 NOTE — Progress Notes (Signed)
Subjective:   Patient ID: Ralph Tapia, male   DOB: 34 y.o.   MRN: [id number]   HPI Patient presents stating doing well with my right foot with significant reduction discomfort and back to work full-time   ROS      Objective:  Physical Exam  Neurovascular status intact negative Denna Haggard' sign noted with wound edges right well-healed and motion which is slightly restricted still but improved from previous to surgery with no crepitus of the joint or pain     Assessment:  Doing well post osteotomy right first metatarsal     Plan:  H&P reviewed condition and recommended continuation of physical therapy walking on the entire foot and increased activity.  Reappoint 8 weeks final visit or earlier if needed  X-ray indicates osteotomy is healing well with fixation in place no indications of pathology with slight changes to the joint surface but not changed from prior to procedure

## 2019-04-12 DIAGNOSIS — M25674 Stiffness of right foot, not elsewhere classified: Secondary | ICD-10-CM | POA: Diagnosis not present

## 2019-04-12 DIAGNOSIS — M629 Disorder of muscle, unspecified: Secondary | ICD-10-CM | POA: Diagnosis not present

## 2019-04-12 DIAGNOSIS — M25571 Pain in right ankle and joints of right foot: Secondary | ICD-10-CM | POA: Diagnosis not present

## 2019-04-12 DIAGNOSIS — R262 Difficulty in walking, not elsewhere classified: Secondary | ICD-10-CM | POA: Diagnosis not present

## 2019-04-14 DIAGNOSIS — M25674 Stiffness of right foot, not elsewhere classified: Secondary | ICD-10-CM | POA: Diagnosis not present

## 2019-04-14 DIAGNOSIS — M25571 Pain in right ankle and joints of right foot: Secondary | ICD-10-CM | POA: Diagnosis not present

## 2019-04-14 DIAGNOSIS — M629 Disorder of muscle, unspecified: Secondary | ICD-10-CM | POA: Diagnosis not present

## 2019-04-14 DIAGNOSIS — R262 Difficulty in walking, not elsewhere classified: Secondary | ICD-10-CM | POA: Diagnosis not present

## 2019-06-05 ENCOUNTER — Ambulatory Visit (INDEPENDENT_AMBULATORY_CARE_PROVIDER_SITE_OTHER): Payer: BC Managed Care – PPO | Admitting: Podiatry

## 2019-06-05 ENCOUNTER — Ambulatory Visit (INDEPENDENT_AMBULATORY_CARE_PROVIDER_SITE_OTHER): Payer: BC Managed Care – PPO

## 2019-06-05 ENCOUNTER — Other Ambulatory Visit: Payer: Self-pay

## 2019-06-05 ENCOUNTER — Encounter: Payer: Self-pay | Admitting: Podiatry

## 2019-06-05 VITALS — Temp 98.2°F

## 2019-06-05 DIAGNOSIS — M205X1 Other deformities of toe(s) (acquired), right foot: Secondary | ICD-10-CM

## 2019-06-05 NOTE — Progress Notes (Signed)
Subjective:   Patient ID: Ralph Tapia, male   DOB: 34 y.o.   MRN: [id number]   HPI Patient presents stating he is doing very well very pleased with surgery and walking with a better heel toe gait   ROS      Objective:  Physical Exam  Neurovascular status intact muscle strength found to be adequate range of motion within normal limits.  Patient has good range of motion of the first MPJ no crepitus of the joint currently incision site healed well     Assessment:  Doing well post osteotomy right first metatarsal     Plan:  Reviewed condition recommended that continue activities and smart range of motion and patient will be seen back for [place] to recheck again 6 weeks or earlier if needed  X-rays indicate osteotomies healing well fixation in place joint congruence

## 2019-08-26 ENCOUNTER — Other Ambulatory Visit: Payer: Self-pay

## 2019-08-26 ENCOUNTER — Encounter: Payer: Self-pay | Admitting: Emergency Medicine

## 2019-08-26 ENCOUNTER — Ambulatory Visit
Admission: EM | Admit: 2019-08-26 | Discharge: 2019-08-26 | Disposition: A | Discharge status: Home / Self Care | Payer: BC Managed Care – PPO | Attending: Emergency Medicine | Admitting: Emergency Medicine

## 2019-08-26 DIAGNOSIS — H60392 Other infective otitis externa, left ear: Secondary | ICD-10-CM | POA: Diagnosis not present

## 2019-08-26 MED ORDER — CIPROFLOXACIN-DEXAMETHASONE 0.3-0.1 % OT SUSP: 4.0000 [drp] | Freq: Two times a day (BID) | OTIC | 0 refills | Status: DC

## 2019-08-26 NOTE — ED Provider Notes (Signed)
[name]   [id number] 08/26/19 Arrival Time: 1226  Chief Complaint  Patient presents with  . Otalgia     SUBJECTIVE: History from: patient.  Ralph Tapia is a 34 y.o. male who presented to the urgent care for complaint of left ear pain for the past 2 days.  Denies a precipitating event, such as swimming or wearing ear plugs.  Patient states the pain is constant and achy in character.  Has not tried any OTC medication.  Symptoms are made worse with lying down.  Denies similar symptoms in the past.    Denies fever, chills, fatigue, sinus pain, rhinorrhea, ear discharge, sore throat, SOB, wheezing, chest pain, nausea, changes in bowel or bladder habits.    ROS: As per HPI.  All other pertinent ROS negative.      Past Medical History:  Diagnosis Date  . Alcohol dependence ([facility]) 01/21/2015  . Chronic tonsillitis 08/2011   snores during sleep, occ. stops breathing, and wakes up gasping/choking; has not had sleep study  . Essential hypertension 06/16/2017  . Psoriasis 12/11/2013  . Smoker   . Tobacco abuse    Past Surgical History:  Procedure Laterality Date  . FRACTURE SURGERY Right 2009   orif  . LAPAROSCOPIC APPENDECTOMY  12/21/2004  . ORIF FIBULA FRACTURE  08/18/2007   right distal fib.  . TONSILLECTOMY  08/18/2011   Procedure: TONSILLECTOMY;  Surgeon: Drema Halon, MD;  Location:  SURGERY CENTER;  Service: ENT;  Laterality: Bilateral;  . TONSILLECTOMY  2013   No Known Allergies No current facility-administered medications on file prior to encounter.   Current Outpatient Medications on File Prior to Encounter  Medication Sig Dispense Refill  . amLODipine (NORVASC) 10 MG tablet Take 1 tablet (10 mg total) by mouth daily. 90 tablet 1  . benzonatate (TESSALON) 200 MG capsule Take 1 capsule (200 mg total) by mouth 3 (three) times daily as needed for cough. 21 capsule 0  . carvedilol (COREG) 3.125 MG tablet Take 1 tablet (3.125 mg total) by mouth 2 (two)  times daily. 180 tablet 3  . clobetasol cream (TEMOVATE) 0.05 % Apply 1 application topically 2 (two) times daily.    . diclofenac (VOLTAREN) 75 MG EC tablet Take 1 tablet (75 mg total) by mouth 2 (two) times daily. 50 tablet 2  . fluticasone (CUTIVATE) 0.05 % cream     . HUMIRA PEN 40 MG/0.8ML PNKT Inject 1 Syringe into the skin every 14 (fourteen) days.    Marland Kitchen ketoconazole (NIZORAL) 2 % cream Apply  as directed to affected area twice a day as needed For itch/flare    . mupirocin ointment (BACTROBAN) 2 % Apply  as directed to affected area twice a day     Social History   Socioeconomic History  . Marital status: Married    Spouse name: Not on file  . Number of children: Not on file  . Years of education: Not on file  . Highest education level: Not on file  Occupational History  . Occupation: Works at [employer]: [employer]  Tobacco Use  . Smoking status: Current Every Day Smoker    Packs/day: 1.00    Years: 15.00    Pack years: 15.00    Types: Cigarettes    Start date: 2005  . Smokeless tobacco: Never Used  . Tobacco comment: 10/31/18 -pack or more a day  Substance and Sexual Activity  . Alcohol use: Yes    Comment: 3-4 x/week  .  Drug use: No  . Sexual activity: Yes  Other Topics Concern  . Not on file  Social History Narrative   Works as Production designer, theatre/television/film at [employer]   Married.   No children   No exercise.   Social Determinants of Health   Financial Resource Strain:   . Difficulty of Paying Living Expenses:   Food Insecurity:   . Worried About Programme researcher, broadcasting/film/video in the Last Year:   . Barista in the Last Year:   Transportation Needs:   . Freight forwarder (Medical):   Marland Kitchen Lack of Transportation (Non-Medical):   Physical Activity:   . Days of Exercise per Week:   . Minutes of Exercise per Session:   Stress:   . Feeling of Stress :   Social Connections:   . Frequency of Communication with Friends and Family:   . Frequency of Social Gatherings with  Friends and Family:   . Attends Religious Services:   . Active Member of Clubs or Organizations:   . Attends Banker Meetings:   Marland Kitchen Marital Status:   Intimate Partner Violence:   . Fear of Current or Ex-Partner:   . Emotionally Abused:   Marland Kitchen Physically Abused:   . Sexually Abused:    Family History  Problem Relation Age of Onset  . Hypertension Mother   . Hypertension Father     OBJECTIVE:  Vitals:   08/26/19 1230  BP: (!) 143/87  Pulse: 80  Resp: 16  Temp: 98 F (36.7 C)  TempSrc: Oral  SpO2: 98%     Physical Exam Vitals and nursing note reviewed.  Constitutional:      General: He is not in acute distress.    Appearance: Normal appearance. He is normal weight. He is not ill-appearing, toxic-appearing or diaphoretic.  HENT:     Right Ear: Hearing, tympanic membrane, ear canal and external ear normal. There is no impacted cerumen.     Left Ear: Hearing, tympanic membrane and external ear normal. Swelling and tenderness present. There is no impacted cerumen.     Ears:     Comments: Left ear: Tenderness and swelling in the ear canal.  Pain when pulling ear tragus. Cardiovascular:     Rate and Rhythm: Normal rate and regular rhythm.     Pulses: Normal pulses.     Heart sounds: Normal heart sounds. No murmur heard.  No friction rub. No gallop.   Pulmonary:     Effort: Pulmonary effort is normal. No respiratory distress.     Breath sounds: Normal breath sounds. No stridor. No wheezing, rhonchi or rales.  Chest:     Chest wall: No tenderness.  Neurological:     Mental Status: He is alert and oriented to person, place, and time.    Imaging: No results found.   ASSESSMENT & PLAN:  1. Infective otitis externa of left ear     Meds ordered this encounter  Medications  . ciprofloxacin-dexamethasone (CIPRODEX) OTIC suspension    Sig: Place 4 drops into the left ear 2 (two) times daily.    Dispense:  7.5 mL    Refill:  0   Discharge instructions Rest  and drink plenty of fluids Prescribed ciprodex ear drops Take medications as directed and to completion Continue to use OTC ibuprofen and/ or tylenol as needed for pain control Follow up with PCP if symptoms persists Return here or go to the ER if you have any new or worsening symptoms  Reviewed expectations re: course of current medical issues. Questions answered. Outlined signs and symptoms indicating need for more acute intervention. Patient verbalized understanding. After Visit Summary given.      Note: This document was prepared using Dragon voice recognition software and may include unintentional dictation errors.    Durward Parcel, FNP 08/26/19 1313

## 2019-08-26 NOTE — ED Triage Notes (Signed)
LT ear pain x 2 days with some jaw pain now

## 2019-08-26 NOTE — Discharge Instructions (Addendum)
Rest and drink plenty of fluids Prescribed ciprodex ear drops Take medications as directed and to completion Continue to use OTC ibuprofen and/ or tylenol as needed for pain control Follow up with PCP if symptoms persists Return here or go to the ER if you have any new or worsening symptoms  

## 2019-10-18 ENCOUNTER — Other Ambulatory Visit: Payer: Self-pay

## 2019-10-18 ENCOUNTER — Ambulatory Visit
Admission: EM | Admit: 2019-10-18 | Discharge: 2019-10-18 | Discharge status: Absent without leave | Payer: BC Managed Care – PPO

## 2019-10-19 DIAGNOSIS — M25512 Pain in left shoulder: Secondary | ICD-10-CM | POA: Diagnosis not present

## 2019-10-19 DIAGNOSIS — Z7189 Other specified counseling: Secondary | ICD-10-CM | POA: Diagnosis not present

## 2019-10-25 DIAGNOSIS — M542 Cervicalgia: Secondary | ICD-10-CM | POA: Diagnosis not present

## 2019-10-25 DIAGNOSIS — M25512 Pain in left shoulder: Secondary | ICD-10-CM | POA: Diagnosis not present

## 2019-11-08 DIAGNOSIS — M542 Cervicalgia: Secondary | ICD-10-CM | POA: Diagnosis not present

## 2019-11-13 DIAGNOSIS — Z720 Tobacco use: Secondary | ICD-10-CM | POA: Diagnosis not present

## 2019-11-13 DIAGNOSIS — L409 Psoriasis, unspecified: Secondary | ICD-10-CM | POA: Diagnosis not present

## 2019-11-13 DIAGNOSIS — M5412 Radiculopathy, cervical region: Secondary | ICD-10-CM | POA: Diagnosis not present

## 2019-11-13 DIAGNOSIS — I10 Essential (primary) hypertension: Secondary | ICD-10-CM | POA: Diagnosis not present

## 2019-11-15 DIAGNOSIS — M2569 Stiffness of other specified joint, not elsewhere classified: Secondary | ICD-10-CM | POA: Diagnosis not present

## 2019-11-15 DIAGNOSIS — M5412 Radiculopathy, cervical region: Secondary | ICD-10-CM | POA: Diagnosis not present

## 2019-11-15 DIAGNOSIS — M542 Cervicalgia: Secondary | ICD-10-CM | POA: Diagnosis not present

## 2019-11-21 DIAGNOSIS — M542 Cervicalgia: Secondary | ICD-10-CM | POA: Diagnosis not present

## 2019-11-21 DIAGNOSIS — M5412 Radiculopathy, cervical region: Secondary | ICD-10-CM | POA: Diagnosis not present

## 2019-11-21 DIAGNOSIS — M2569 Stiffness of other specified joint, not elsewhere classified: Secondary | ICD-10-CM | POA: Diagnosis not present

## 2019-11-23 DIAGNOSIS — M5412 Radiculopathy, cervical region: Secondary | ICD-10-CM | POA: Diagnosis not present

## 2019-11-23 DIAGNOSIS — M542 Cervicalgia: Secondary | ICD-10-CM | POA: Diagnosis not present

## 2019-11-23 DIAGNOSIS — M5022 Other cervical disc displacement, mid-cervical region, unspecified level: Secondary | ICD-10-CM | POA: Diagnosis not present

## 2019-11-24 DIAGNOSIS — M2569 Stiffness of other specified joint, not elsewhere classified: Secondary | ICD-10-CM | POA: Diagnosis not present

## 2019-11-24 DIAGNOSIS — M542 Cervicalgia: Secondary | ICD-10-CM | POA: Diagnosis not present

## 2019-11-24 DIAGNOSIS — M5412 Radiculopathy, cervical region: Secondary | ICD-10-CM | POA: Diagnosis not present

## 2019-12-01 DIAGNOSIS — M5412 Radiculopathy, cervical region: Secondary | ICD-10-CM | POA: Diagnosis not present

## 2019-12-01 DIAGNOSIS — M542 Cervicalgia: Secondary | ICD-10-CM | POA: Diagnosis not present

## 2019-12-01 DIAGNOSIS — M5022 Other cervical disc displacement, mid-cervical region, unspecified level: Secondary | ICD-10-CM | POA: Diagnosis not present

## 2019-12-06 DIAGNOSIS — M542 Cervicalgia: Secondary | ICD-10-CM | POA: Diagnosis not present

## 2019-12-06 DIAGNOSIS — M5412 Radiculopathy, cervical region: Secondary | ICD-10-CM | POA: Diagnosis not present

## 2019-12-06 DIAGNOSIS — M2569 Stiffness of other specified joint, not elsewhere classified: Secondary | ICD-10-CM | POA: Diagnosis not present

## 2019-12-25 DIAGNOSIS — M542 Cervicalgia: Secondary | ICD-10-CM | POA: Diagnosis not present

## 2019-12-25 DIAGNOSIS — M5412 Radiculopathy, cervical region: Secondary | ICD-10-CM | POA: Diagnosis not present

## 2019-12-27 ENCOUNTER — Other Ambulatory Visit: Payer: Self-pay | Admitting: Interventional Cardiology

## 2019-12-27 NOTE — Telephone Encounter (Signed)
Left message for patient to call back  

## 2019-12-28 NOTE — Telephone Encounter (Signed)
Follow Up:      Pt is returning [place] call from yesterday

## 2019-12-28 NOTE — Telephone Encounter (Signed)
Patient overdue for f/u. He will see Dr. Eldridge Dace on 11/23. He states that he has enough carvedilol to last until then.

## 2020-01-01 NOTE — Progress Notes (Signed)
Cardiology Office Note   Date:  01/02/2020   ID:  Ralph Tapia, DOB 12-04-1985, MRN [id number]  PCP:  Tally Joe, MD    No chief complaint on file.  HTN  Wt Readings from Last 3 Encounters:  01/02/20 157 lb (71.2 kg)  10/31/18 170 lb (77.1 kg)  10/11/18 174 lb 12.8 oz (79.3 kg)       History of Present Illness: Ralph Tapia is a 34 y.o. male  With HTN.  He was started medicine for BP in 2019. Amlodipine was started and then the dose was increased.  He still had elevated diastolic readings. A second medicine was started , lisinopril, but he did not tolerate this. He did not tolerate HCTZ.  Both parents have HTN requiring medication.  He is not sure when they started on meds.   At 05/2018 visit, it was noted that: " He does not have any sx when his BP is high. He is a Production designer, theatre/television/film for [employer].  Home readings are in usually in the 135/90 range."  Plan at that time was: "HTN: Continue amlodipine. Diastolics typically a little bit over 90. Systolics in the 130s. Would not add another medicine at this time. WOuld improve lifestyle. Decrease alcohol and energy drink intake. Decrease red meat and salt intake. Decrease consumption of processed foods. Increase exercise to 150 minutes / week. Try to get regular sleep. Avoid screentime just before bed to help with sleep. Check BP at home when relaxed.   If lifestyle measures fail, will have to consider a second drug, perhaps low dose Carvedilol. I would prefer to not add another drug.   He needs to stop smoking as well."  Carvedilol was added in 2020.  Did not tolerate HCTZ or lisinopril. Counseled to stop smoking and minimize alcohol.   Since the last visit, his BPs  Are in the 120-135 systolic and 85-100 diastolic.    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   Limits salt.  During the day, he will have an energy drink.  He is drinking 5-6 beers daily.   Denies : Chest pain. Dizziness.  Leg edema. Nitroglycerin use. Orthopnea. Palpitations. Paroxysmal nocturnal dyspnea. Shortness of breath. Syncope.   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     Past Medical History:  Diagnosis Date  . Alcohol dependence (HCC) 01/21/2015  . Chronic tonsillitis 08/2011   snores during sleep, occ. stops breathing, and wakes up gasping/choking; has not had sleep study  . Essential hypertension 06/16/2017  . Psoriasis 12/11/2013  . Smoker   . Tobacco abuse     Past Surgical History:  Procedure Laterality Date  . FRACTURE SURGERY Right 2009   orif  . LAPAROSCOPIC APPENDECTOMY  12/21/2004  . ORIF FIBULA FRACTURE  08/18/2007   right distal fib.  . TONSILLECTOMY  08/18/2011   Procedure: TONSILLECTOMY;  Surgeon: Drema Halon, MD;  Location: [facility];  Service: ENT;  Laterality: Bilateral;  . TONSILLECTOMY  2013     Current Outpatient Medications  Medication Sig Dispense Refill  . carvedilol (COREG) 3.125 MG tablet Take 1 tablet (3.125 mg total) by mouth 2 (two) times daily. 180 tablet 3  . clobetasol cream (TEMOVATE) 0.05 % Apply 1 application topically 2 (two) times daily.    . fluticasone (CUTIVATE) 0.05 % cream     . HUMIRA PEN 40 MG/0.8ML PNKT Inject 1 Syringe into the skin every 14 (fourteen) days.    Marland Kitchen  ketoconazole (NIZORAL) 2 % cream Apply  as directed to affected area twice a day as needed For itch/flare    . amLODipine (NORVASC) 10 MG tablet Take 1 tablet (10 mg total) by mouth daily. 90 tablet 1   No current facility-administered medications for this visit.    Allergies:   Patient has no known allergies.    Social History:  The patient  reports that he has been smoking cigarettes. He started smoking about 16 years ago. He has a 15.00 pack-year smoking history. He has never used smokeless tobacco. He reports current alcohol use. He reports that he does not use drugs.   Family History:  The patient's family history includes  Hypertension in his father and mother.    ROS:  Please see the history of present illness.   Otherwise, review of systems are positive for stress.   All other systems are reviewed and negative.    PHYSICAL EXAM: VS:  BP 130/88   Pulse 77   Ht 6' (1.829 m)   Wt 157 lb (71.2 kg)   SpO2 97%   BMI 21.29 kg/m  , BMI Body mass index is 21.29 kg/m. GEN: Well nourished, well developed, in no acute distress  HEENT: normal  Neck: no JVD, carotid bruits, or masses Cardiac: RRR; no murmurs, rubs, or gallops,no edema  Respiratory:  clear to auscultation bilaterally, normal work of breathing GI: soft, nontender, nondistended, + BS, thin MS: no deformity or atrophy  Skin: warm and dry, no rash Neuro:  Strength and sensation are intact Psych: euthymic mood, full affect   EKG:   The ekg ordered today demonstrates NSR, no ST changes   Recent Labs: No results found for requested labs within last 8760 hours.   Lipid Panel    Component Value Date/Time   CHOL 201 (H) 06/16/2017 0919   TRIG 44 06/16/2017 0919   HDL 58 06/16/2017 0919   CHOLHDL 3.5 06/16/2017 0919   VLDL 10 10/05/2012 1033   LDLCALC 130 (H) 06/16/2017 0919     Other studies Reviewed: Additional studies/ records that were reviewed today with results demonstrating: TC < 200.   ASSESSMENT AND PLAN:  1. HTN: The current medical regimen is effective;  continue present plan and medications. Repeat BP 128/82. Decrease caffeine intake.  No more energy drinks.  Decrease alcohol intake as well to 1-2 beers/day, from 6/day.  2. Tobacco abuse: Not interested in quitting.  1.5 ppd.  It helps with stress of the job. If he canges careers, he may be interested.   3. Alcohol use: I counseled him to decrease due to risk to liver and esophagus.    Current medicines are reviewed at length with the patient today.  The patient concerns regarding his medicines were addressed.  The following changes have been made:  No change  Labs/  tests ordered today include:  No orders of the defined types were placed in this encounter.   Recommend 150 minutes/week of aerobic exercise Low fat, low carb, high fiber diet recommended  Disposition:   FU in 1 year   Signed, Lance Muss, MD  01/02/2020 3:27 PM    [facility] [address] [place], [place], [place]  [address] Phone: [phone]; Fax: [phone]

## 2020-01-02 ENCOUNTER — Encounter: Payer: Self-pay | Admitting: Interventional Cardiology

## 2020-01-02 ENCOUNTER — Ambulatory Visit (INDEPENDENT_AMBULATORY_CARE_PROVIDER_SITE_OTHER): Payer: BC Managed Care – PPO | Admitting: Interventional Cardiology

## 2020-01-02 ENCOUNTER — Other Ambulatory Visit: Payer: Self-pay

## 2020-01-02 VITALS — BP 130/88 | HR 77 | Ht 72.0 in | Wt 157.0 lb

## 2020-01-02 DIAGNOSIS — F17200 Nicotine dependence, unspecified, uncomplicated: Secondary | ICD-10-CM | POA: Diagnosis not present

## 2020-01-02 DIAGNOSIS — I10 Essential (primary) hypertension: Secondary | ICD-10-CM | POA: Diagnosis not present

## 2020-01-02 DIAGNOSIS — Z7289 Other problems related to lifestyle: Secondary | ICD-10-CM

## 2020-01-02 DIAGNOSIS — Z789 Other specified health status: Secondary | ICD-10-CM

## 2020-01-02 NOTE — Patient Instructions (Signed)
Medication Instructions:  Your physician recommends that you continue on your current medications as directed. Please refer to the Current Medication list given to you today.  *If you need a refill on your cardiac medications before your next appointment, please call your pharmacy*   Lab Work: None  If you have labs (blood work) drawn today and your tests are completely normal, you will receive your results only by:  MyChart Message (if you have MyChart) OR  A paper copy in the mail If you have any lab test that is abnormal or we need to change your treatment, we will call you to review the results.   Testing/Procedures: None   Follow-Up: At [facility], you and your health needs are our priority.  As part of our continuing mission to provide you with exceptional heart care, we have created designated Provider Care Teams.  These Care Teams include your primary Cardiologist (physician) and Advanced Practice Providers (APPs -  Physician Assistants and Nurse Practitioners) who all work together to provide you with the care you need, when you need it.  We recommend signing up for the patient portal called "MyChart".  Sign up information is provided on this After Visit Summary.  MyChart is used to connect with patients for Virtual Visits (Telemedicine).  Patients are able to view lab/test results, encounter notes, upcoming appointments, etc.  Non-urgent messages can be sent to your provider as well.   To learn more about what you can do with MyChart, go to [email or web address].    Your next appointment:   12 month(s)  The format for your next appointment:   In Person  Provider:   You may see Everette Rank, MD or one of the following Advanced Practice Providers on your designated Care Team:    Ronie Spies, PA-C  Jacolyn Reedy, PA-C    Other Instructions Decrease the amount of energy drinks that you drink  1-800-QUIT-NOW   Steps to Quit Smoking Smoking tobacco is the  leading cause of preventable death. It can affect almost every organ in the body. Smoking puts you and people around you at risk for many serious, long-lasting (chronic) diseases. Quitting smoking can be hard, but it is one of the best things that you can do for your health. It is never too late to quit. How do I get ready to quit? When you decide to quit smoking, make a plan to help you succeed. Before you quit:  Pick a date to quit. Set a date within the next 2 weeks to give you time to prepare.  Write down the reasons why you are quitting. Keep this list in places where you will see it often.  Tell your family, friends, and co-workers that you are quitting. Their support is important.  Talk with your doctor about the choices that may help you quit.  Find out if your health insurance will pay for these treatments.  Know the people, places, things, and activities that make you want to smoke (triggers). Avoid them. What first steps can I take to quit smoking?  Throw away all cigarettes at home, at work, and in your car.  Throw away the things that you use when you smoke, such as ashtrays and lighters.  Clean your car. Make sure to empty the ashtray.  Clean your home, including curtains and carpets. What can I do to help me quit smoking? Talk with your doctor about taking medicines and seeing a counselor at the same time. You are more likely  to succeed when you do both.  If you are pregnant or breastfeeding, talk with your doctor about counseling or other ways to quit smoking. Do not take medicine to help you quit smoking unless your doctor tells you to do so. To quit smoking: Quit right away  Quit smoking totally, instead of slowly cutting back on how much you smoke over a period of time.  Go to counseling. You are more likely to quit if you go to counseling sessions regularly. Take medicine You may take medicines to help you quit. Some medicines need a prescription, and some you  can buy over-the-counter. Some medicines may contain a drug called nicotine to replace the nicotine in cigarettes. Medicines may:  Help you to stop having the desire to smoke (cravings).  Help to stop the problems that come when you stop smoking (withdrawal symptoms). Your doctor may ask you to use:  Nicotine patches, gum, or lozenges.  Nicotine inhalers or sprays.  Non-nicotine medicine that is taken by mouth. Find resources Find resources and other ways to help you quit smoking and remain smoke-free after you quit. These resources are most helpful when you use them often. They include:  Online chats with a Veterinary surgeon.  Phone quitlines.  Printed Materials engineer.  Support groups or group counseling.  Text messaging programs.  Mobile phone apps. Use apps on your mobile phone or tablet that can help you stick to your quit plan. There are many free apps for mobile phones and tablets as well as websites. Examples include Quit Guide from the [employer] and smokefree.gov  What things can I do to make it easier to quit?   Talk to your family and friends. Ask them to support and encourage you.  Call a phone quitline (1-800-QUIT-NOW), reach out to support groups, or work with a Veterinary surgeon.  Ask people who smoke to not smoke around you.  Avoid places that make you want to smoke, such as: ? Bars. ? Parties. ? Smoke-break areas at work.  Spend time with people who do not smoke.  Lower the stress in your life. Stress can make you want to smoke. Try these things to help your stress: ? Getting regular exercise. ? Doing deep-breathing exercises. ? Doing yoga. ? Meditating. ? Doing a body scan. To do this, close your eyes, focus on one area of your body at a time from head to toe. Notice which parts of your body are tense. Try to relax the muscles in those areas. How will I feel when I quit smoking? Day 1 to 3 weeks Within the first 24 hours, you may start to have some problems that come  from quitting tobacco. These problems are very bad 2-3 days after you quit, but they do not often last for more than 2-3 weeks. You may get these symptoms:  Mood swings.  Feeling restless, nervous, angry, or annoyed.  Trouble concentrating.  Dizziness.  Strong desire for high-sugar foods and nicotine.  Weight gain.  Trouble pooping (constipation).  Feeling like you may vomit (nausea).  Coughing or a sore throat.  Changes in how the medicines that you take for other issues work in your body.  Depression.  Trouble sleeping (insomnia). Week 3 and afterward After the first 2-3 weeks of quitting, you may start to notice more positive results, such as:  Better sense of smell and taste.  Less coughing and sore throat.  Slower heart rate.  Lower blood pressure.  Clearer skin.  Better breathing.  Fewer sick  days. Quitting smoking can be hard. Do not give up if you fail the first time. Some people need to try a few times before they succeed. Do your best to stick to your quit plan, and talk with your doctor if you have any questions or concerns. Summary  Smoking tobacco is the leading cause of preventable death. Quitting smoking can be hard, but it is one of the best things that you can do for your health.  When you decide to quit smoking, make a plan to help you succeed.  Quit smoking right away, not slowly over a period of time.  When you start quitting, seek help from your doctor, family, or friends. This information is not intended to replace advice given to you by your health care provider. Make sure you discuss any questions you have with your health care provider. Document Revised: 10/21/2018 Document Reviewed: 04/16/2018 Elsevier Patient Education  2020 [employer].

## 2020-01-09 DIAGNOSIS — F17210 Nicotine dependence, cigarettes, uncomplicated: Secondary | ICD-10-CM | POA: Diagnosis not present

## 2020-01-09 DIAGNOSIS — Z716 Tobacco abuse counseling: Secondary | ICD-10-CM | POA: Diagnosis not present

## 2020-01-09 DIAGNOSIS — M5412 Radiculopathy, cervical region: Secondary | ICD-10-CM | POA: Diagnosis not present

## 2020-02-05 DIAGNOSIS — M5412 Radiculopathy, cervical region: Secondary | ICD-10-CM | POA: Diagnosis not present

## 2020-02-06 DIAGNOSIS — M5412 Radiculopathy, cervical region: Secondary | ICD-10-CM | POA: Diagnosis not present

## 2020-02-06 DIAGNOSIS — G9520 Unspecified cord compression: Secondary | ICD-10-CM | POA: Diagnosis not present

## 2020-02-06 DIAGNOSIS — M4802 Spinal stenosis, cervical region: Secondary | ICD-10-CM | POA: Diagnosis not present

## 2020-02-20 DIAGNOSIS — L400 Psoriasis vulgaris: Secondary | ICD-10-CM | POA: Diagnosis not present

## 2020-02-20 DIAGNOSIS — L718 Other rosacea: Secondary | ICD-10-CM | POA: Diagnosis not present

## 2020-02-21 DIAGNOSIS — Z79899 Other long term (current) drug therapy: Secondary | ICD-10-CM | POA: Diagnosis not present

## 2020-02-21 DIAGNOSIS — L409 Psoriasis, unspecified: Secondary | ICD-10-CM | POA: Diagnosis not present

## 2020-02-21 DIAGNOSIS — Z9889 Other specified postprocedural states: Secondary | ICD-10-CM | POA: Diagnosis not present

## 2020-03-19 DIAGNOSIS — Z9889 Other specified postprocedural states: Secondary | ICD-10-CM | POA: Diagnosis not present

## 2020-04-11 ENCOUNTER — Other Ambulatory Visit: Payer: Self-pay | Admitting: Interventional Cardiology

## 2020-04-16 DIAGNOSIS — Z9889 Other specified postprocedural states: Secondary | ICD-10-CM | POA: Diagnosis not present

## 2020-06-07 DIAGNOSIS — F411 Generalized anxiety disorder: Secondary | ICD-10-CM | POA: Diagnosis not present

## 2020-06-07 DIAGNOSIS — Z720 Tobacco use: Secondary | ICD-10-CM | POA: Diagnosis not present

## 2020-06-07 DIAGNOSIS — F321 Major depressive disorder, single episode, moderate: Secondary | ICD-10-CM | POA: Diagnosis not present

## 2020-07-04 DIAGNOSIS — F411 Generalized anxiety disorder: Secondary | ICD-10-CM | POA: Diagnosis not present

## 2020-07-04 DIAGNOSIS — F321 Major depressive disorder, single episode, moderate: Secondary | ICD-10-CM | POA: Diagnosis not present

## 2020-07-04 DIAGNOSIS — R6882 Decreased libido: Secondary | ICD-10-CM | POA: Diagnosis not present

## 2020-07-04 DIAGNOSIS — I10 Essential (primary) hypertension: Secondary | ICD-10-CM | POA: Diagnosis not present

## 2020-08-08 DIAGNOSIS — H6123 Impacted cerumen, bilateral: Secondary | ICD-10-CM | POA: Diagnosis not present

## 2020-08-08 DIAGNOSIS — F321 Major depressive disorder, single episode, moderate: Secondary | ICD-10-CM | POA: Diagnosis not present

## 2020-08-08 DIAGNOSIS — D7589 Other specified diseases of blood and blood-forming organs: Secondary | ICD-10-CM | POA: Diagnosis not present

## 2020-08-08 DIAGNOSIS — F411 Generalized anxiety disorder: Secondary | ICD-10-CM | POA: Diagnosis not present

## 2020-08-08 DIAGNOSIS — I10 Essential (primary) hypertension: Secondary | ICD-10-CM | POA: Diagnosis not present

## 2020-08-20 DIAGNOSIS — F1020 Alcohol dependence, uncomplicated: Secondary | ICD-10-CM | POA: Diagnosis not present

## 2020-08-20 DIAGNOSIS — Z630 Problems in relationship with spouse or partner: Secondary | ICD-10-CM | POA: Diagnosis not present

## 2020-08-20 DIAGNOSIS — Z635 Disruption of family by separation and divorce: Secondary | ICD-10-CM | POA: Diagnosis not present

## 2020-09-09 DIAGNOSIS — H60502 Unspecified acute noninfective otitis externa, left ear: Secondary | ICD-10-CM | POA: Diagnosis not present

## 2020-10-18 ENCOUNTER — Encounter: Payer: Self-pay | Admitting: Emergency Medicine

## 2020-10-18 ENCOUNTER — Ambulatory Visit
Admission: EM | Admit: 2020-10-18 | Discharge: 2020-10-18 | Disposition: A | Discharge status: Home / Self Care | Payer: BC Managed Care – PPO

## 2020-10-18 ENCOUNTER — Other Ambulatory Visit: Payer: Self-pay

## 2020-10-18 DIAGNOSIS — M25572 Pain in left ankle and joints of left foot: Secondary | ICD-10-CM | POA: Diagnosis not present

## 2020-10-18 DIAGNOSIS — M25571 Pain in right ankle and joints of right foot: Secondary | ICD-10-CM

## 2020-10-18 DIAGNOSIS — S93409A Sprain of unspecified ligament of unspecified ankle, initial encounter: Secondary | ICD-10-CM

## 2020-10-18 MED ORDER — PREDNISONE 20 MG PO TABS: 20.0000 mg | ORAL_TABLET | Freq: Two times a day (BID) | ORAL | 0 refills | Status: AC

## 2020-10-18 NOTE — ED Provider Notes (Signed)
[facility]   [id number] 10/18/20 Arrival Time: 1847  CC: ankle PAIN  SUBJECTIVE: History from: patient. Ralph Tapia is a 35 y.o. male complains of bilateral ankle pain x 1 week.  Denies a precipitating event or specific injury.  Has been doing Photographer.  Localizes the pain to the inside of both ankles.  Describes the pain as intermittent and sharp in character.  Has tried OTC medications without relief.  Symptoms are made worse with exercise.  Denies similar symptoms in the past.  Denies fever, chills, erythema, ecchymosis, effusion, weakness, numbness and tingling.  ROS: As per HPI.  All other pertinent ROS negative.     Past Medical History:  Diagnosis Date   Alcohol dependence (HCC) 01/21/2015   Chronic tonsillitis 08/2011   snores during sleep, occ. stops breathing, and wakes up gasping/choking; has not had sleep study   Essential hypertension 06/16/2017   Psoriasis 12/11/2013   Smoker    Tobacco abuse    Past Surgical History:  Procedure Laterality Date   FRACTURE SURGERY Right 2009   orif   LAPAROSCOPIC APPENDECTOMY  12/21/2004   ORIF FIBULA FRACTURE  08/18/2007   right distal fib.   TONSILLECTOMY  08/18/2011   Procedure: TONSILLECTOMY;  Surgeon: Drema Halon, MD;  Location: [facility];  Service: ENT;  Laterality: Bilateral;   TONSILLECTOMY  2013   No Known Allergies No current facility-administered medications on file prior to encounter.   Current Outpatient Medications on File Prior to Encounter  Medication Sig Dispense Refill   escitalopram (LEXAPRO) 10 MG tablet Take 10 mg by mouth daily.     amLODipine (NORVASC) 10 MG tablet Take 1 tablet (10 mg total) by mouth daily. 90 tablet 1   carvedilol (COREG) 3.125 MG tablet TAKE 1 TABLET (3.125 MG TOTAL) BY MOUTH 2 (TWO) TIMES DAILY. 180 tablet 3   clobetasol cream (TEMOVATE) 0.05 % Apply 1 application topically 2 (two) times daily.     fluticasone (CUTIVATE) 0.05 % cream       HUMIRA PEN 40 MG/0.8ML PNKT Inject 1 Syringe into the skin every 14 (fourteen) days.     ketoconazole (NIZORAL) 2 % cream Apply  as directed to affected area twice a day as needed For itch/flare     Social History   Socioeconomic History   Marital status: Married    Spouse name: Not on file   Number of children: Not on file   Years of education: Not on file   Highest education level: Not on file  Occupational History   Occupation: Works at [employer]    Employer: [employer]  Tobacco Use   Smoking status: Every Day    Packs/day: 1.00    Years: 15.00    Pack years: 15.00    Types: Cigarettes    Start date: 2005   Smokeless tobacco: Never   Tobacco comments:    10/31/18 -pack or more a day  Substance and Sexual Activity   Alcohol use: Yes    Comment: 3-4 x/week   Drug use: No   Sexual activity: Yes  Other Topics Concern   Not on file  Social History Narrative   Works as Production designer, theatre/television/film at [employer]   Married.   No children   No exercise.   Social Determinants of Health   Financial Resource Strain: Not on file  Food Insecurity: Not on file  Transportation Needs: Not on file  Physical Activity: Not on file  Stress: Not on  file  Social Connections: Not on file  Intimate Partner Violence: Not on file   Family History  Problem Relation Age of Onset   Hypertension Mother    Hypertension Father     OBJECTIVE:  Vitals:   10/18/20 1852  BP: (!) 145/90  Pulse: 88  Resp: 16  Temp: 97.8 F (36.6 C)  TempSrc: Temporal  SpO2: 98%    General appearance: ALERT; in no acute distress.  Head: NCAT Lungs: Normal respiratory effort CV: Dorsalis pedis pulse 2+ Musculoskeletal: Bilateral ankle Inspection: Skin warm, dry, clear and intact without obvious erythema, effusion, or ecchymosis.  Palpation: TTP over medial aspects of ankles ROM: FROM active and passive Strength: deferred Skin: warm and dry Neurologic: Ambulates with minimal difficulty; Sensation intact about the upper/  lower extremities Psychological: alert and cooperative; normal mood and affect   ASSESSMENT & PLAN:  1. Acute bilateral ankle pain   2. Sprain of ankle, unspecified laterality, unspecified ligament, initial encounter     Meds ordered this encounter  Medications   predniSONE (DELTASONE) 20 MG tablet    Sig: Take 1 tablet (20 mg total) by mouth 2 (two) times daily with a meal for 5 days.    Dispense:  10 tablet    Refill:  0    Order Specific Question:   Supervising Provider    Answer:   Eustace Moore [[id number]]    Continue conservative management of rest, ice, and elevation Avoid painful activities Prednisone prescribed Follow up with orthopedist for further evaluation and management Return or go to the ER if you have any new or worsening symptoms (fever, chills, chest pain, redness, swelling, bruising, etc...)   Reviewed expectations re: course of current medical issues. Questions answered. Outlined signs and symptoms indicating need for more acute intervention. Patient verbalized understanding. After Visit Summary given.     Rennis Harding, PA-C 10/18/20 1859

## 2020-10-18 NOTE — ED Triage Notes (Signed)
Bilateral heel/ side of foot  pain  since last Friday.

## 2020-10-18 NOTE — Discharge Instructions (Signed)
Continue conservative management of rest, ice, and elevation Avoid painful activities Prednisone prescribed Follow up with orthopedist for further evaluation and management Return or go to the ER if you have any new or worsening symptoms (fever, chills, chest pain, redness, swelling, bruising, etc...)

## 2020-10-19 DIAGNOSIS — M25571 Pain in right ankle and joints of right foot: Secondary | ICD-10-CM | POA: Diagnosis not present

## 2020-10-19 DIAGNOSIS — M25572 Pain in left ankle and joints of left foot: Secondary | ICD-10-CM | POA: Diagnosis not present

## 2020-10-26 ENCOUNTER — Ambulatory Visit
Admission: EM | Admit: 2020-10-26 | Discharge: 2020-10-26 | Disposition: A | Discharge status: Home / Self Care | Payer: BC Managed Care – PPO | Attending: Family Medicine | Admitting: Family Medicine

## 2020-10-26 ENCOUNTER — Encounter: Payer: Self-pay | Admitting: Emergency Medicine

## 2020-10-26 ENCOUNTER — Other Ambulatory Visit: Payer: Self-pay

## 2020-10-26 DIAGNOSIS — S46911A Strain of unspecified muscle, fascia and tendon at shoulder and upper arm level, right arm, initial encounter: Secondary | ICD-10-CM

## 2020-10-26 MED ORDER — KETOROLAC TROMETHAMINE 60 MG/2ML IM SOLN
60.0000 mg | Freq: Once | INTRAMUSCULAR | Status: AC
  Administered 2020-10-26: 60 mg via INTRAMUSCULAR

## 2020-10-26 MED ORDER — DICLOFENAC SODIUM 1 % EX GEL: 4.0000 g | Freq: Four times a day (QID) | CUTANEOUS | 0 refills | Status: AC

## 2020-10-26 MED ORDER — NAPROXEN 500 MG PO TABS: 500.0000 mg | ORAL_TABLET | Freq: Two times a day (BID) | ORAL | 0 refills | Status: AC

## 2020-10-26 NOTE — ED Triage Notes (Signed)
Rt shoulder pain x 1 week. 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

## 2020-10-26 NOTE — ED Provider Notes (Signed)
[facility]    CSN: [id number] Arrival date & time: 10/26/20  0806      History   Chief Complaint No chief complaint on file.   HPI Ralph Tapia is a 35 y.o. male.   HPI Patient with a history of hypertension, psoriasis, current smoker and alcohol dependence presents today with right shoulder pain x1 week.  Patient was seen here on 10/18/2020 for acute bilateral ankle pain and possible sprain injury and was placed on prednisone for total of 5 days.  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 Alcohol dependence (HCC) 01/21/2015  . Chronic tonsillitis 08/2011   snores during sleep, occ. stops breathing, and wakes up gasping/choking; has not had sleep study  . Essential hypertension 06/16/2017  . Psoriasis 12/11/2013  . Smoker   . Tobacco abuse     Patient Active Problem List   Diagnosis Date Noted  . Hemoptysis 11/08/2018  . Chronic cough 10/11/2018  . Essential hypertension 06/16/2017  . Alcohol dependence (HCC) 01/21/2015  . Psoriasis 12/11/2013  . Smoker     Past Surgical History:  Procedure Laterality Date  . FRACTURE SURGERY Right 2009   orif  . LAPAROSCOPIC APPENDECTOMY  12/21/2004  . ORIF FIBULA FRACTURE  08/18/2007   right distal fib.  . TONSILLECTOMY  08/18/2011   Procedure: TONSILLECTOMY;  Surgeon: Drema Halon, MD;  Location: [facility];  Service: ENT;  Laterality: Bilateral;  . TONSILLECTOMY  2013       Home Medications    Prior to Admission medications   Medication Sig Start Date End Date Taking? Authorizing Provider  diclofenac Sodium (VOLTAREN) 1 % GEL Apply 4 g topically 4 (four) times daily. 10/26/20  Yes Bing Neighbors, FNP  naproxen (NAPROSYN) 500 MG tablet Take 1 tablet (500 mg total) by mouth 2 (two) times daily with a meal for 10 days. 10/26/20 11/05/20 Yes Bing Neighbors, FNP   amLODipine (NORVASC) 10 MG tablet Take 1 tablet (10 mg total) by mouth daily. 07/19/17 10/11/18  Allayne Butcher B, PA-C  carvedilol (COREG) 3.125 MG tablet TAKE 1 TABLET (3.125 MG TOTAL) BY MOUTH 2 (TWO) TIMES DAILY. 04/11/20   Corky Crafts, MD  clobetasol cream (TEMOVATE) 0.05 % Apply 1 application topically 2 (two) times daily. 11/03/18   [provider]  escitalopram (LEXAPRO) 10 MG tablet Take 10 mg by mouth daily.    [provider]  fluticasone (CUTIVATE) 0.05 % cream  05/04/18   [provider]  HUMIRA PEN 40 MG/0.8ML PNKT Inject 1 Syringe into the skin every 14 (fourteen) days. 12/22/18   [provider]  ketoconazole (NIZORAL) 2 % cream Apply  as directed to affected area twice a day as needed For itch/flare 11/03/18   [provider]    Family History Family History  Problem Relation Age of Onset  . Hypertension Mother   . Hypertension Father     Social History Social History   Tobacco Use  . Smoking status: Every Day    Packs/day: 1.00    Years: 15.00    Pack years: 15.00    Types: Cigarettes    Start date: 2005  . Smokeless tobacco: Never  . Tobacco comments:    10/31/18 -pack or more a day  Substance Use Topics  . Alcohol use: Yes  Comment: 3-4 x/week  . Drug use: No     Allergies   Patient has no known allergies.   Review of Systems Review of Systems Pertinent negatives listed in HPI    Physical Exam Triage Vital Signs ED Triage Vitals  Enc Vitals Group     BP 10/26/20 0828 132/80     Pulse Rate 10/26/20 0828 75     Resp 10/26/20 0828 18     Temp 10/26/20 0828 98.2 F (36.8 C)     Temp Source 10/26/20 0828 Oral     SpO2 10/26/20 0828 98 %     Weight --      Height --      Head Circumference --      Peak Flow --      Pain Score 10/26/20 0829 7     Pain Loc --      Pain Edu? --      Excl. in GC? --    No data found.  Updated Vital Signs BP 132/80 (BP Location: Right Arm)   Pulse 75   Temp  98.2 F (36.8 C) (Oral)   Resp 18   SpO2 98%   Visual Acuity Right Eye Distance:   Left Eye Distance:   Bilateral Distance:    Right Eye Near:   Left Eye Near:    Bilateral Near:     Physical Exam Constitutional:      Appearance: Normal appearance.  HENT:     Head: Normocephalic.  Eyes:     Extraocular Movements: Extraocular movements intact.     Pupils: Pupils are equal, round, and reactive to light.  Cardiovascular:     Rate and Rhythm: Normal rate and regular rhythm.  Pulmonary:     Effort: Pulmonary effort is normal.     Breath sounds: Normal breath sounds.  Musculoskeletal:     Cervical back: Normal range of motion.  Skin:    Capillary Refill: Capillary refill takes less than 2 seconds.  Neurological:     Mental Status: He is alert.  Psychiatric:        Mood and Affect: Mood normal.        Behavior: Behavior normal.        Thought Content: Thought content normal.        Judgment: Judgment normal.     UC Treatments / Results  Labs (all labs ordered are listed, but only abnormal results are displayed) Labs Reviewed - No data to display  EKG   Radiology No results found.  Procedures Procedures (including critical care time)  Medications Ordered in UC Medications  ketorolac (TORADOL) injection 60 mg (60 mg Intramuscular Given 10/26/20 0918)    Initial Impression / Assessment and Plan / UC Course  I have reviewed the triage vital signs and the nursing notes.  Pertinent labs & imaging results that were available during my care of the patient were reviewed by me and considered in my medical decision making (see chart for details).    Right shoulder strain, Toradol 60 mg IM given here in clinic today.  Patient will continue home management with Voltaren gel 4 times daily topically as needed and Peroxin 500 mg twice daily for total of 10 days.  Continue to apply heat increase range of motion activity as tolerated.  Follow-up with Ortho if symptoms worsen  or do not improve Final Clinical Impressions(s) / UC Diagnoses   Final diagnoses:  Strain of right shoulder, initial encounter   Discharge Instructions  None    ED Prescriptions     Medication Sig Dispense Auth. Provider   diclofenac Sodium (VOLTAREN) 1 % GEL Apply 4 g topically 4 (four) times daily. 100 g Bing Neighbors, FNP   naproxen (NAPROSYN) 500 MG tablet Take 1 tablet (500 mg total) by mouth 2 (two) times daily with a meal for 10 days. 30 tablet Bing Neighbors, FNP      PDMP not reviewed this encounter.   Bing Neighbors, [place] 10/26/20 [phone]

## 2020-11-16 ENCOUNTER — Other Ambulatory Visit: Payer: Self-pay

## 2020-11-16 ENCOUNTER — Ambulatory Visit (INDEPENDENT_AMBULATORY_CARE_PROVIDER_SITE_OTHER): Payer: BC Managed Care – PPO

## 2020-11-16 ENCOUNTER — Encounter: Payer: Self-pay | Admitting: Emergency Medicine

## 2020-11-16 ENCOUNTER — Ambulatory Visit
Admission: EM | Admit: 2020-11-16 | Discharge: 2020-11-16 | Disposition: A | Discharge status: Home / Self Care | Payer: BC Managed Care – PPO | Attending: Family Medicine | Admitting: Family Medicine

## 2020-11-16 DIAGNOSIS — M25511 Pain in right shoulder: Secondary | ICD-10-CM

## 2020-11-16 MED ORDER — PREDNISONE 20 MG PO TABS: 40.0000 mg | ORAL_TABLET | Freq: Every day | ORAL | 0 refills | Status: AC

## 2020-11-16 NOTE — ED Triage Notes (Signed)
Patient c/o RT shoulder pain x 2 weeks.   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   Patient endorses the training involved " a lot of physical activity".   Patient endorses increased pain with "coughing, sneezing, or breathing".   Patient denies previous injury.   Patient was seen previously at this clinic and was prescribed naproxen with no relief of symptoms.

## 2020-11-18 NOTE — ED Provider Notes (Signed)
[facility]   [id number] 11/16/20 Arrival Time: 0800  ASSESSMENT & PLAN:  1. Acute pain of right shoulder    I have personally viewed the imaging studies ordered this visit. No acute changes of R shoulder appreciated.  Begin trial of: Meds ordered this encounter  Medications   predniSONE (DELTASONE) 20 MG tablet    Sig: Take 2 tablets (40 mg total) by mouth daily.    Dispense:  14 tablet    Refill:  0    Orders Placed This Encounter  Procedures   DG Shoulder Right    Recommend:  Follow-up Information     Tally Joe, MD.   Specialty: Family Medicine Why: As needed. Contact information: [address] [place] [address] [phone]                 Reviewed expectations re: course of current medical issues. Questions answered. Outlined signs and symptoms indicating need for more acute intervention. Patient verbalized understanding. After Visit Summary given.  SUBJECTIVE: History from: patient. Ralph Tapia is a 35 y.o. male who reports RIGHT shoulder pain; 2 weeks; gradual onset; has been in [employer] when symptoms began. No direct shoulder trauma. No extremity sensation changes or weakness. Better when he rests shoulder. Symptoms have waxed and waned but are worse overall since beginning. No assoc CP/SOB. Prev seen here; Rx Naprosyn without much help.  Past Surgical History:  Procedure Laterality Date   FRACTURE SURGERY Right 2009   orif   LAPAROSCOPIC APPENDECTOMY  12/21/2004   ORIF FIBULA FRACTURE  08/18/2007   right distal fib.   TONSILLECTOMY  08/18/2011   Procedure: TONSILLECTOMY;  Surgeon: Drema Halon, MD;  Location: [facility];  Service: ENT;  Laterality: Bilateral;   TONSILLECTOMY  2013      OBJECTIVE:  Vitals:   11/16/20 0810  BP: 127/84  Pulse: 73  Resp: 14  Temp: 97.7 F (36.5 C)  TempSrc: Oral  SpO2: 97%    General appearance: alert; no distress HEENT: [place];  AT Neck: supple with FROM Resp: unlabored respirations Extremities: RUE: warm with well perfused appearance; poorly localized mild to moderate tenderness over right posterior shoulder; without gross deformities; swelling: none; bruising: none; shoulder ROM: normal, with discomfort CV: brisk extremity capillary refill of RUE; 2+ radial pulse of RUE. Skin: warm and dry; no visible rashes Neurologic: gait normal; normal sensation and strength of RUE Psychological: alert and cooperative; normal mood and affect  Imaging: DG Shoulder Right  Result Date: 11/16/2020 CLINICAL DATA:  Nontraumatic right shoulder pain for the past 2 weeks. EXAM: RIGHT SHOULDER - 2+ VIEW COMPARISON:  None. FINDINGS: No fracture or dislocation. Glenohumeral joint spaces appear preserved. Mild degenerative change the right AC joint with joint space loss and inferiorly directed osteophytosis. No evidence of calcific tendinitis. Limited visualization adjacent thorax is normal. Post lower cervical ACDF, incompletely evaluated. IMPRESSION: 1. Mild degenerative change of the right AC joint. 2. Post lower cervical ACDF, incompletely evaluated. Electronically Signed   By: Simonne Come M.D.   On: 11/16/2020 08:34       Allergies  Allergen Reactions   Naproxen Hives    Past Medical History:  Diagnosis Date   Alcohol dependence (HCC) 01/21/2015   Chronic tonsillitis 08/2011   snores during sleep, occ. stops breathing, and wakes up gasping/choking; has not had sleep study   Essential hypertension 06/16/2017   Psoriasis 12/11/2013   Smoker    Tobacco abuse  Social History   Socioeconomic History   Marital status: Married    Spouse name: Not on file   Number of children: Not on file   Years of education: Not on file   Highest education level: Not on file  Occupational History   Occupation: Works at [employer]    Employer: [employer]  Tobacco Use   Smoking status: Every Day    Packs/day: 1.00    Years: 15.00    Pack  years: 15.00    Types: Cigarettes    Start date: 2005   Smokeless tobacco: Never   Tobacco comments:    10/31/18 -pack or more a day  Substance and Sexual Activity   Alcohol use: Yes    Comment: 3-4 x/week   Drug use: No   Sexual activity: Yes  Other Topics Concern   Not on file  Social History Narrative   Works as Production designer, theatre/television/film at [employer]   Married.   No children   No exercise.   Social Determinants of Health   Financial Resource Strain: Not on file  Food Insecurity: Not on file  Transportation Needs: Not on file  Physical Activity: Not on file  Stress: Not on file  Social Connections: Not on file   Family History  Problem Relation Age of Onset   Hypertension Mother    Hypertension Father    Past Surgical History:  Procedure Laterality Date   FRACTURE SURGERY Right 2009   orif   LAPAROSCOPIC APPENDECTOMY  12/21/2004   ORIF FIBULA FRACTURE  08/18/2007   right distal fib.   TONSILLECTOMY  08/18/2011   Procedure: TONSILLECTOMY;  Surgeon: Drema Halon, MD;  Location: [facility];  Service: ENT;  Laterality: Bilateral;   TONSILLECTOMY  2013       Mardella Layman, MD 11/18/20 1204

## 2020-11-24 ENCOUNTER — Ambulatory Visit
Admission: EM | Admit: 2020-11-24 | Discharge: 2020-11-24 | Disposition: A | Discharge status: Home / Self Care | Payer: BC Managed Care – PPO | Attending: Physician Assistant | Admitting: Physician Assistant

## 2020-11-24 ENCOUNTER — Other Ambulatory Visit: Payer: Self-pay

## 2020-11-24 DIAGNOSIS — S61211A Laceration without foreign body of left index finger without damage to nail, initial encounter: Secondary | ICD-10-CM

## 2020-11-24 NOTE — ED Triage Notes (Signed)
Yesterday, while using a hedge trimmer Pt reports that his left 2nd digit "got caught" in the trimmer causing a laceration near the nail bed.  Has used peroxide and neosporin. Pt was able to control the bleeding temporairly with a band aid.

## 2020-11-24 NOTE — Discharge Instructions (Addendum)
Follow up with any further concerns.  

## 2020-11-24 NOTE — ED Provider Notes (Signed)
[facility]    CSN: [id number] Arrival date & time: 11/24/20  0806      History   Chief Complaint Chief Complaint  Patient presents with   Finger Injury    Left second digit    HPI Ralph Tapia is a 35 y.o. male.   Patient here today for evaluation of laceration to his left index finger that occurred yesterday while using hedge tremors.  He reports that he did have bleeding but it is more controlled at this time.  He denies any numbness or tingling.Patient up to date with Tdap (2019).  The history is provided by the patient.   Past Medical History:  Diagnosis Date   Alcohol dependence (HCC) 01/21/2015   Chronic tonsillitis 08/2011   snores during sleep, occ. stops breathing, and wakes up gasping/choking; has not had sleep study   Essential hypertension 06/16/2017   Psoriasis 12/11/2013   Smoker    Tobacco abuse     Patient Active Problem List   Diagnosis Date Noted   Hemoptysis 11/08/2018   Chronic cough 10/11/2018   Essential hypertension 06/16/2017   Alcohol dependence (HCC) 01/21/2015   Psoriasis 12/11/2013   Smoker     Past Surgical History:  Procedure Laterality Date   FRACTURE SURGERY Right 2009   orif   LAPAROSCOPIC APPENDECTOMY  12/21/2004   ORIF FIBULA FRACTURE  08/18/2007   right distal fib.   TONSILLECTOMY  08/18/2011   Procedure: TONSILLECTOMY;  Surgeon: Drema Halon, MD;  Location: [facility];  Service: ENT;  Laterality: Bilateral;   TONSILLECTOMY  2013       Home Medications    Prior to Admission medications   Medication Sig Start Date End Date Taking? Authorizing Provider  amLODipine (NORVASC) 10 MG tablet Take 1 tablet (10 mg total) by mouth daily. 07/19/17 10/11/18  Allayne Butcher B, PA-C  carvedilol (COREG) 3.125 MG tablet TAKE 1 TABLET (3.125 MG TOTAL) BY MOUTH 2 (TWO) TIMES DAILY. 04/11/20   Corky Crafts, MD  clobetasol cream (TEMOVATE) 0.05 % Apply 1 application topically 2 (two) times daily. 11/03/18    [provider]  diclofenac Sodium (VOLTAREN) 1 % GEL Apply 4 g topically 4 (four) times daily. 10/26/20   Bing Neighbors, FNP  escitalopram (LEXAPRO) 10 MG tablet Take 10 mg by mouth daily.    [provider]  fluticasone (CUTIVATE) 0.05 % cream  05/04/18   [provider]  HUMIRA PEN 40 MG/0.8ML PNKT Inject 1 Syringe into the skin every 14 (fourteen) days. 12/22/18   [provider]  ketoconazole (NIZORAL) 2 % cream Apply  as directed to affected area twice a day as needed For itch/flare 11/03/18   [provider]  predniSONE (DELTASONE) 20 MG tablet Take 2 tablets (40 mg total) by mouth daily. 11/16/20   Mardella Layman, MD    Family History Family History  Problem Relation Age of Onset   Hypertension Mother    Hypertension Father     Social History Social History   Tobacco Use   Smoking status: Every Day    Packs/day: 1.00    Years: 15.00    Pack years: 15.00    Types: Cigarettes    Start date: 2005   Smokeless tobacco: Never   Tobacco comments:    10/31/18 -pack or more a day  Substance Use Topics   Alcohol use: Yes    Comment: 3-4 x/week   Drug use: No     Allergies  Naproxen   Review of Systems Review of Systems  Constitutional:  Negative for chills and fever.  Eyes:  Negative for discharge and redness.  Respiratory:  Negative for shortness of breath.   Skin:  Positive for color change and wound.  Neurological:  Negative for numbness.    Physical Exam Triage Vital Signs ED Triage Vitals  Enc Vitals Group     BP 11/24/20 0815 130/84     Pulse Rate 11/24/20 0815 85     Resp 11/24/20 0815 18     Temp 11/24/20 0815 97.9 F (36.6 C)     Temp Source 11/24/20 0815 Oral     SpO2 11/24/20 0815 96 %     Weight --      Height --      Head Circumference --      Peak Flow --      Pain Score 11/24/20 0822 4     Pain Loc --      Pain Edu? --      Excl. in GC? --    No data found.  Updated Vital Signs BP 130/84  (BP Location: Left Arm)   Pulse 85   Temp 97.9 F (36.6 C) (Oral)   Resp 18   SpO2 96%    Physical Exam Vitals and nursing note reviewed.  Constitutional:      General: He is not in acute distress.    Appearance: Normal appearance. He is not ill-appearing.  HENT:     Head: Normocephalic and atraumatic.  Eyes:     Conjunctiva/sclera: Conjunctivae normal.  Cardiovascular:     Rate and Rhythm: Normal rate.  Pulmonary:     Effort: Pulmonary effort is normal.  Musculoskeletal:     Comments: Decreased ROM of left 2nd DIP due to swelling  Skin:    Capillary Refill: Normal cap refill of left distal index finger    Comments: ~ 2 cm laceration to left distal index finger proximal to cuticle with minimal bleeding noted. Bruising noted to distal finger.  Neurological:     Mental Status: He is alert.     Comments: Gross sensation intact to distal left index finger  Psychiatric:        Mood and Affect: Mood normal.        Behavior: Behavior normal.        Thought Content: Thought content normal.     UC Treatments / Results  Labs (all labs ordered are listed, but only abnormal results are displayed) Labs Reviewed - No data to display  EKG   Radiology No results found.  Procedures Laceration Repair  Date/Time: 11/24/2020 9:09 AM Performed by: Tomi Bamberger, PA-C Authorized by: Tomi Bamberger, PA-C   Consent:    Consent obtained:  Verbal   Consent given by:  Patient   Risks discussed:  Infection, pain, poor cosmetic result and poor wound healing   Alternatives discussed:  No treatment and delayed treatment Universal protocol:    Procedure explained and questions answered to patient or proxy's satisfaction: yes     Relevant documents present and verified: yes     Patient identity confirmed:  Verbally with patient Anesthesia:    Anesthesia method:  None Laceration details:    Location:  Finger   Finger location:  L index finger   Length (cm):  2   Depth (mm):   0 Treatment:    Area cleansed with:  Chlorhexidine   Amount of cleaning:  Standard Skin repair:  Repair method:  Tissue adhesive Approximation:    Approximation:  Close Repair type:    Repair type:  Simple Post-procedure details:    Dressing:  Open (no dressing)   Procedure completion:  Tolerated well, no immediate complications (including critical care time)  Medications Ordered in UC Medications - No data to display  Initial Impression / Assessment and Plan / UC Course  I have reviewed the triage vital signs and the nursing notes.  Pertinent labs & imaging results that were available during my care of the patient were reviewed by me and considered in my medical decision making (see chart for details).  Given proximity to cuticle discussed some concerns for nail damage, and discussed options of being seen in the ED for further evaluation by specialist versus simple closure here.  Discussed risks of closure in our office and patient agrees he would still like to have laceration repaired here.  Dermabond used to close wound without complication.  No active bleeding.  Recommended follow-up with any signs or symptoms of infection or with any further concerns.  Final Clinical Impressions(s) / UC Diagnoses   Final diagnoses:  Laceration of left index finger without foreign body without damage to nail, initial encounter     Discharge Instructions      Follow up with any further concerns.      ED Prescriptions   None    PDMP not reviewed this encounter.   Tomi Bamberger, PA-C 11/24/20 [phone]

## 2021-01-18 ENCOUNTER — Other Ambulatory Visit: Payer: Self-pay

## 2021-01-18 ENCOUNTER — Encounter (HOSPITAL_COMMUNITY): Payer: Self-pay

## 2021-01-18 ENCOUNTER — Emergency Department (HOSPITAL_COMMUNITY)
Admission: EM | Admit: 2021-01-18 | Discharge: 2021-01-19 | Disposition: A | Discharge status: Home / Self Care | Payer: BC Managed Care – PPO | Attending: Emergency Medicine | Admitting: Emergency Medicine

## 2021-01-18 ENCOUNTER — Emergency Department (HOSPITAL_COMMUNITY): Payer: BC Managed Care – PPO

## 2021-01-18 ENCOUNTER — Ambulatory Visit: Admission: EM | Admit: 2021-01-18 | Discharge: 2021-01-18 | Payer: BC Managed Care – PPO

## 2021-01-18 ENCOUNTER — Encounter: Payer: Self-pay | Admitting: Emergency Medicine

## 2021-01-18 DIAGNOSIS — S91115A Laceration without foreign body of left lesser toe(s) without damage to nail, initial encounter: Secondary | ICD-10-CM | POA: Diagnosis not present

## 2021-01-18 DIAGNOSIS — W108XXA Fall (on) (from) other stairs and steps, initial encounter: Secondary | ICD-10-CM | POA: Diagnosis not present

## 2021-01-18 DIAGNOSIS — F17210 Nicotine dependence, cigarettes, uncomplicated: Secondary | ICD-10-CM | POA: Diagnosis not present

## 2021-01-18 DIAGNOSIS — I10 Essential (primary) hypertension: Secondary | ICD-10-CM | POA: Diagnosis not present

## 2021-01-18 DIAGNOSIS — S91312A Laceration without foreign body, left foot, initial encounter: Secondary | ICD-10-CM | POA: Diagnosis not present

## 2021-01-18 DIAGNOSIS — S99922A Unspecified injury of left foot, initial encounter: Secondary | ICD-10-CM | POA: Diagnosis not present

## 2021-01-18 DIAGNOSIS — M79672 Pain in left foot: Secondary | ICD-10-CM

## 2021-01-18 NOTE — ED Triage Notes (Signed)
Pt arrived via POV from UC for aprox 1.5 laceration between the 2nd and third digit of the left foot. Pt states he the laceration is the result of him falling down the stairs at home today. Bleeding is controlled.

## 2021-01-18 NOTE — ED Notes (Signed)
Patient is being discharged from the Urgent Care and sent to the Emergency Department via private vehicle . Per PA, patient is in need of higher level of care due to laceration between toes. Patient is aware and verbalizes understanding of plan of care.  Vitals:   01/18/21 1434  BP: 126/74  Pulse: 76  Resp: 18  Temp: (!) 97.4 F (36.3 C)  SpO2: 97%

## 2021-01-18 NOTE — ED Triage Notes (Signed)
Slipped down stairs and has laceration to left foot in between 2 and 3rd toe

## 2021-01-18 NOTE — ED Provider Notes (Signed)
Emergency Medicine Provider Triage Evaluation Note  Ralph Tapia , a 35 y.o. male  was evaluated in triage.  Pt complains of Left foot laceration.  He was seen at urgent care and sent here.  He denies any blood thinner use.  He states that he slipped on the stairs.  No other injuries. Tdap 06/16/2017.    Review of Systems  Positive: Laceration Negative: Syncope, head injury  Physical Exam  BP 115/77   Pulse 79   Temp 98.4 F (36.9 C) (Oral)   Resp 14   Ht 6' (1.829 m)   Wt 71.2 kg   SpO2 100%   BMI 21.29 kg/m  Gen:   Awake, no distress   Resp:  Normal effort  MSK:   Moves extremities without difficulty  Other:  There is a deep laceration in between the left second and third toes.  There is tenderness over the digits.  Bleeding is controlled.  Medical Decision Making  Medically screening exam initiated at 5:51 PM.  Appropriate orders placed.  Ralph Tapia was informed that the remainder of the evaluation will be completed by another provider, this initial triage assessment does not replace that evaluation, and the importance of remaining in the ED until their evaluation is complete.  X-rays ordered given trauma mechanism and the depth of the wound to evaluate for underlying fracture.    [name] 01/18/21 1802    Cheryll Cockayne, MD 01/20/21 [phone]

## 2021-01-18 NOTE — ED Provider Notes (Signed)
[facility]   MRN: [id number] DOB: Mar 14, 1985  Subjective:   Ralph Tapia is a 35 y.o. male presenting for suffering a left foot injury today.  Patient slipped on stairs and caught his foot in between the third and left toe.  He came to our clinic for an evaluation.  No current facility-administered medications for this encounter.  Current Outpatient Medications:    amLODipine (NORVASC) 10 MG tablet, Take 1 tablet (10 mg total) by mouth daily., Disp: 90 tablet, Rfl: 1   carvedilol (COREG) 3.125 MG tablet, TAKE 1 TABLET (3.125 MG TOTAL) BY MOUTH 2 (TWO) TIMES DAILY., Disp: 180 tablet, Rfl: 3   clobetasol cream (TEMOVATE) 0.05 %, Apply 1 application topically 2 (two) times daily., Disp: , Rfl:    diclofenac Sodium (VOLTAREN) 1 % GEL, Apply 4 g topically 4 (four) times daily., Disp: 100 g, Rfl: 0   escitalopram (LEXAPRO) 10 MG tablet, Take 10 mg by mouth daily., Disp: , Rfl:    fluticasone (CUTIVATE) 0.05 % cream, , Disp: , Rfl:    HUMIRA PEN 40 MG/0.8ML PNKT, Inject 1 Syringe into the skin every 14 (fourteen) days., Disp: , Rfl:    ketoconazole (NIZORAL) 2 % cream, Apply  as directed to affected area twice a day as needed For itch/flare, Disp: , Rfl:    predniSONE (DELTASONE) 20 MG tablet, Take 2 tablets (40 mg total) by mouth daily., Disp: 14 tablet, Rfl: 0   Allergies  Allergen Reactions   Naproxen Hives    Past Medical History:  Diagnosis Date   Alcohol dependence (HCC) 01/21/2015   Chronic tonsillitis 08/2011   snores during sleep, occ. stops breathing, and wakes up gasping/choking; has not had sleep study   Essential hypertension 06/16/2017   Psoriasis 12/11/2013   Smoker    Tobacco abuse      Past Surgical History:  Procedure Laterality Date   FRACTURE SURGERY Right 2009   orif   LAPAROSCOPIC APPENDECTOMY  12/21/2004   ORIF FIBULA FRACTURE  08/18/2007   right distal fib.   TONSILLECTOMY  08/18/2011   Procedure: TONSILLECTOMY;  Surgeon: Drema Halon,  MD;  Location: [facility];  Service: ENT;  Laterality: Bilateral;   TONSILLECTOMY  2013    Family History  Problem Relation Age of Onset   Hypertension Mother    Hypertension Father     Social History   Tobacco Use   Smoking status: Every Day    Packs/day: 1.00    Years: 15.00    Pack years: 15.00    Types: Cigarettes    Start date: 2005   Smokeless tobacco: Never   Tobacco comments:    10/31/18 -pack or more a day  Substance Use Topics   Alcohol use: Yes    Comment: 3-4 x/week   Drug use: No    ROS   Objective:   Vitals: BP 126/74 (BP Location: Right Arm)   Pulse 76   Temp (!) 97.4 F (36.3 C) (Oral)   Resp 18   SpO2 97%   Physical Exam Constitutional:      General: He is not in acute distress.    Appearance: Normal appearance. He is well-developed and normal weight. He is not ill-appearing, toxic-appearing or diaphoretic.  HENT:     Head: Normocephalic and atraumatic.     Right Ear: External ear normal.     Left Ear: External ear normal.     Nose: Nose normal.     Mouth/Throat:  Pharynx: Oropharynx is clear.  Eyes:     General: No scleral icterus.       Right eye: No discharge.        Left eye: No discharge.     Extraocular Movements: Extraocular movements intact.     Pupils: Pupils are equal, round, and reactive to light.  Cardiovascular:     Rate and Rhythm: Normal rate.  Pulmonary:     Effort: Pulmonary effort is normal.  Musculoskeletal:     Cervical back: Normal range of motion.       Legs:  Neurological:     Mental Status: He is alert and oriented to person, place, and time.  Psychiatric:        Mood and Affect: Mood normal.        Behavior: Behavior normal.        Thought Content: Thought content normal.        Judgment: Judgment normal.    Assessment and Plan :   PDMP not reviewed this encounter.  1. Laceration of left foot, initial encounter   2. Left foot pain    Patient is in need of a higher level of  laceration repair and wound care than we can provide in the urgent care setting.  A pressure dressing was applied to the wound.  He is being redirected to the emergency room now.   Wallis Bamberg, PA-C 01/18/21 1450

## 2021-01-19 DIAGNOSIS — S91115A Laceration without foreign body of left lesser toe(s) without damage to nail, initial encounter: Secondary | ICD-10-CM | POA: Diagnosis not present

## 2021-01-19 MED ORDER — CEPHALEXIN 500 MG PO CAPS: 500.0000 mg | ORAL_CAPSULE | Freq: Three times a day (TID) | ORAL | 0 refills | Status: DC

## 2021-01-19 MED ORDER — LIDOCAINE HCL 2 % IJ SOLN
20.0000 mL | Freq: Once | INTRAMUSCULAR | Status: AC
  Administered 2021-01-19: 400 mg
  Filled 2021-01-19: qty 20

## 2021-01-19 MED ORDER — BACITRACIN ZINC 500 UNIT/GM EX OINT: 1.0000 "application " | TOPICAL_OINTMENT | Freq: Two times a day (BID) | CUTANEOUS | 0 refills | Status: AC

## 2021-01-19 MED ORDER — CEPHALEXIN 250 MG PO CAPS
500.0000 mg | ORAL_CAPSULE | Freq: Once | ORAL | Status: AC
  Administered 2021-01-19: 500 mg via ORAL
  Filled 2021-01-19: qty 2

## 2021-01-19 MED ORDER — POVIDONE-IODINE 10 % EX SOLN
Freq: Once | CUTANEOUS | Status: DC
  Filled 2021-01-19: qty 118

## 2021-01-19 MED ORDER — BACITRACIN ZINC 500 UNIT/GM EX OINT
TOPICAL_OINTMENT | Freq: Two times a day (BID) | CUTANEOUS | Status: DC
  Filled 2021-01-19: qty 0.9

## 2021-01-19 NOTE — Discharge Instructions (Signed)
Keep wound clean and dry.  Take the antibiotics as prescribed as well as apply the topical antibiotics twice daily.  Sutures should be removed in 10 to 14 days.  Return to the ED sooner with increasing pain, spreading redness, fever, excessive bleeding, excessive drainage or other concerns

## 2021-01-19 NOTE — ED Provider Notes (Signed)
[facility] EMERGENCY DEPARTMENT Provider Note   CSN: [id number] Arrival date & time: 01/18/21  1543     History Chief Complaint  Patient presents with   Extremity Laceration    Ralph Tapia is a 35 y.o. male.  Laceration between second and third toes of left foot after slipping down stairs.  He was seen in urgent care and sent to the ED over 12 hours ago.  Denies any other injuries.  No blood thinner use.  No head, neck, back, chest or abdominal pain foot was injured against a railing.  Tetanus is up-to-date.  No focal numbness or tingling.  Bleeding is controlled  The history is provided by the patient.      Past Medical History:  Diagnosis Date   Alcohol dependence (HCC) 01/21/2015   Chronic tonsillitis 08/2011   snores during sleep, occ. stops breathing, and wakes up gasping/choking; has not had sleep study   Essential hypertension 06/16/2017   Psoriasis 12/11/2013   Smoker    Tobacco abuse     Patient Active Problem List   Diagnosis Date Noted   Hemoptysis 11/08/2018   Chronic cough 10/11/2018   Essential hypertension 06/16/2017   Alcohol dependence (HCC) 01/21/2015   Psoriasis 12/11/2013   Smoker     Past Surgical History:  Procedure Laterality Date   FRACTURE SURGERY Right 2009   orif   LAPAROSCOPIC APPENDECTOMY  12/21/2004   ORIF FIBULA FRACTURE  08/18/2007   right distal fib.   TONSILLECTOMY  08/18/2011   Procedure: TONSILLECTOMY;  Surgeon: Drema Halon, MD;  Location:  [facility];  Service: ENT;  Laterality: Bilateral;   TONSILLECTOMY  2013       Family History  Problem Relation Age of Onset   Hypertension Mother    Hypertension Father     Social History   Tobacco Use   Smoking status: Every Day    Packs/day: 1.00    Years: 15.00    Pack years: 15.00    Types: Cigarettes    Start date: 2005   Smokeless tobacco: Never   Tobacco comments:    10/31/18 -pack or more a day  Substance Use Topics   Alcohol  use: Yes    Alcohol/week: 30.0 standard drinks    Types: 30 Cans of beer per week    Comment: 3-4 x/week   Drug use: No    Home Medications Prior to Admission medications   Medication Sig Start Date End Date Taking? Authorizing Provider  amLODipine (NORVASC) 10 MG tablet Take 1 tablet (10 mg total) by mouth daily. 07/19/17 10/11/18  Allayne Butcher B, PA-C  carvedilol (COREG) 3.125 MG tablet TAKE 1 TABLET (3.125 MG TOTAL) BY MOUTH 2 (TWO) TIMES DAILY. 04/11/20   Corky Crafts, MD  clobetasol cream (TEMOVATE) 0.05 % Apply 1 application topically 2 (two) times daily. 11/03/18   [provider]  diclofenac Sodium (VOLTAREN) 1 % GEL Apply 4 g topically 4 (four) times daily. 10/26/20   Bing Neighbors, FNP  escitalopram (LEXAPRO) 10 MG tablet Take 10 mg by mouth daily.    [provider]  fluticasone (CUTIVATE) 0.05 % cream  05/04/18   [provider]  HUMIRA PEN 40 MG/0.8ML PNKT Inject 1 Syringe into the skin every 14 (fourteen) days. 12/22/18   [provider]  ketoconazole (NIZORAL) 2 % cream Apply  as directed to affected area twice a day as needed For itch/flare 11/03/18   [provider]  predniSONE (DELTASONE)  20 MG tablet Take 2 tablets (40 mg total) by mouth daily. 11/16/20   Mardella Layman, MD    Allergies    Naproxen  Review of Systems   Review of Systems  Constitutional:  Negative for activity change, appetite change and fever.  HENT:  Negative for congestion and rhinorrhea.   Respiratory:  Negative for cough, chest tightness and shortness of breath.   Gastrointestinal:  Negative for abdominal pain, nausea and vomiting.  Genitourinary:  Negative for dysuria.  Musculoskeletal:  Negative for arthralgias and myalgias.  Skin:  Positive for wound.  Neurological:  Negative for dizziness, weakness and headaches.   all other systems are negative except as noted in the HPI and PMH.   Physical Exam Updated Vital Signs BP 117/80 (BP Location:  Right Arm)   Pulse 84   Temp 98.4 F (36.9 C) (Oral)   Resp 17   Ht 6' (1.829 m)   Wt 71.2 kg   SpO2 100%   BMI 21.29 kg/m   Physical Exam Vitals and nursing note reviewed.  Constitutional:      General: He is not in acute distress.    Appearance: He is well-developed.  HENT:     Head: Normocephalic and atraumatic.     Mouth/Throat:     Pharynx: No oropharyngeal exudate.  Eyes:     Conjunctiva/sclera: Conjunctivae normal.     Pupils: Pupils are equal, round, and reactive to light.  Neck:     Comments: No meningismus. Cardiovascular:     Rate and Rhythm: Normal rate and regular rhythm.     Heart sounds: Normal heart sounds. No murmur heard. Pulmonary:     Effort: Pulmonary effort is normal. No respiratory distress.     Breath sounds: Normal breath sounds.  Abdominal:     Palpations: Abdomen is soft.     Tenderness: There is no abdominal tenderness. There is no guarding or rebound.  Musculoskeletal:        General: Tenderness and signs of injury present. Normal range of motion.     Cervical back: Normal range of motion and neck supple.     Comments: Laceration between second and third toes of left foot.  Distal sensation intact. DP and PT pulse intact.  Able to wiggle toes  Skin:    General: Skin is warm.  Neurological:     Mental Status: He is alert and oriented to person, place, and time.     Cranial Nerves: No cranial nerve deficit.     Motor: No abnormal muscle tone.     Coordination: Coordination normal.     Comments:  5/5 strength throughout. CN 2-12 intact.Equal grip strength.   Psychiatric:        Behavior: Behavior normal.    ED Results / Procedures / Treatments   Labs (all labs ordered are listed, but only abnormal results are displayed) Labs Reviewed - No data to display  EKG None  Radiology DG Foot Complete Left  Result Date: 01/18/2021 CLINICAL DATA:  Fall, laceration between 2nd and 3rd toes. EXAM: LEFT FOOT - COMPLETE 3+ VIEW COMPARISON:   None. FINDINGS: There is no evidence of fracture or dislocation. There is no evidence of arthropathy or other focal bone abnormality. Soft tissues are unremarkable. IMPRESSION: Negative. Electronically Signed   By: Charlett Nose M.D.   On: 01/18/2021 18:47    Procedures .Marland KitchenLaceration Repair  Date/Time: 01/19/2021 3:23 AM Performed by: Glynn Octave, MD Authorized by: Glynn Octave, MD   Consent:  Consent obtained:  Verbal   Consent given by:  Patient   Risks, benefits, and alternatives were discussed: yes     Risks discussed:  Infection, need for additional repair, nerve damage, poor wound healing, poor cosmetic result, pain, retained foreign body, tendon damage and vascular damage   Alternatives discussed:  No treatment Universal protocol:    Procedure explained and questions answered to patient or proxy's satisfaction: yes     Relevant documents present and verified: yes     Imaging studies available: yes     Patient identity confirmed:  Verbally with patient Anesthesia:    Anesthesia method:  Local infiltration and nerve block   Local anesthetic:  Lidocaine 1% w/o epi   Block location:  Digital   Block needle gauge:  25 G   Block anesthetic:  Lidocaine 1% w/o epi   Block technique:  Digital   Block injection procedure:  Negative aspiration for blood, introduced needle, incremental injection, anatomic landmarks identified and anatomic landmarks palpated   Block outcome:  Anesthesia achieved Laceration details:    Location:  Toe   Toe location:  L second toe   Length (cm):  2.5 Pre-procedure details:    Preparation:  Patient was prepped and draped in usual sterile fashion and imaging obtained to evaluate for foreign bodies Exploration:    Limited defect created (wound extended): no     Hemostasis achieved with:  Direct pressure   Imaging obtained: x-ray     Imaging outcome: foreign body not noted     Wound exploration: wound explored through full range of motion and  entire depth of wound visualized     Wound extent: areolar tissue violated     Wound extent: no muscle damage noted, no underlying fracture noted and no vascular damage noted     Contaminated: no   Treatment:    Area cleansed with:  Saline   Amount of cleaning:  Extensive   Irrigation solution:  Sterile saline   Irrigation volume:  2000   Irrigation method:  Pressure wash   Visualized foreign bodies/material removed: no     Debridement:  None   Undermining:  None   Scar revision: no   Skin repair:    Repair method:  Sutures   Suture size:  4-0   Suture material:  Nylon   Suture technique:  Simple interrupted   Number of sutures:  6 Approximation:    Approximation:  Loose Repair type:    Repair type:  Complex Post-procedure details:    Dressing:  Antibiotic ointment and adhesive bandage   Procedure completion:  Tolerated   Medications Ordered in ED Medications  povidone-iodine (BETADINE) 10 % external solution (has no administration in time range)  lidocaine (XYLOCAINE) 2 % (with pres) injection 400 mg (has no administration in time range)    ED Course  I have reviewed the triage vital signs and the nursing notes.  Pertinent labs & imaging results that were available during my care of the patient were reviewed by me and considered in my medical decision making (see chart for details).    MDM Rules/Calculators/A&P                          Laceration between second and third toes.  Neurovascularly intact.  X-rays negative.  Tetanus up-to-date.  Deep laceration repaired as above between second and third toes.  Patient tolerated well.  Irrigated extensively prior to repair given length of time  between injury and ED evaluation.  Will give topical and p.o. antibiotics, cast shoe.  Discussed suture removal in 10 to 14 days. Return to the ED sooner with worsening pain, fever, spreading redness, drainage or any other concerns.    Final Clinical Impression(s) / ED  Diagnoses Final diagnoses:  Laceration of left foot, initial encounter    Rx / DC Orders ED Discharge Orders     None        Kanchan Gal, Jeannett Senior, MD 01/19/21 [phone]

## 2021-09-15 IMAGING — CT CT CHEST HIGH RESOLUTION W/O CM
2 of 5 series · 15 of 36 positions shown, 18 images · non-contrast
Comparison: 09/09/2018 chest radiograph.

CLINICAL DATA: Mild-to-moderate hemoptysis for few months.

EXAM:
CT CHEST WITHOUT CONTRAST
TECHNIQUE: Multidetector CT imaging of the chest was performed following the
standard protocol without intravenous contrast. High resolution
imaging of the lungs, as well as inspiratory and expiratory imaging,
was performed.

[Series 2: high resolution · axial · 0.73mm/px · z∈[-363,-57]mm · 12 of 169 slices shown, 15 images]
[im 8/169  mediastinal]
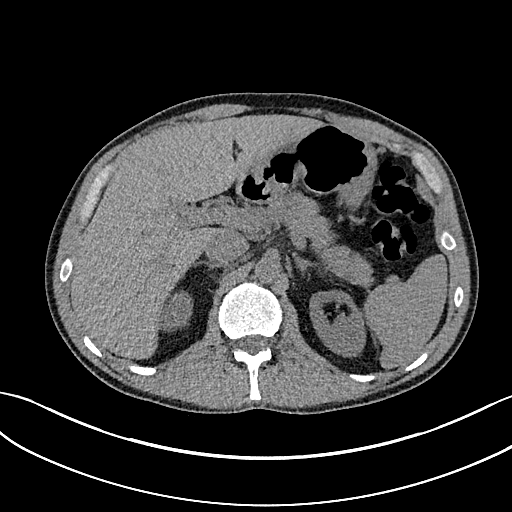
[im 8/169  lung]
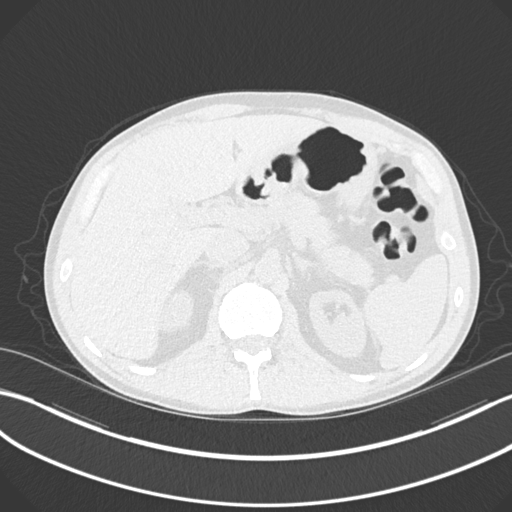
[im 23/169  lung]
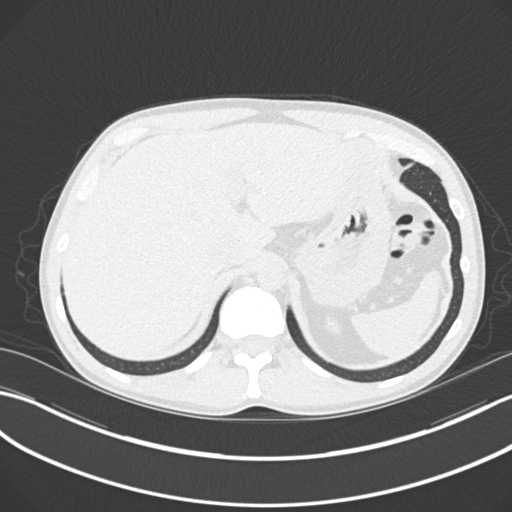
[im 39/169  lung]
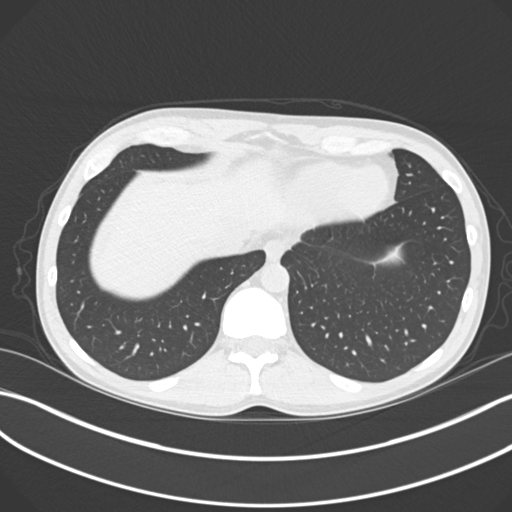
[im 54/169  lung]
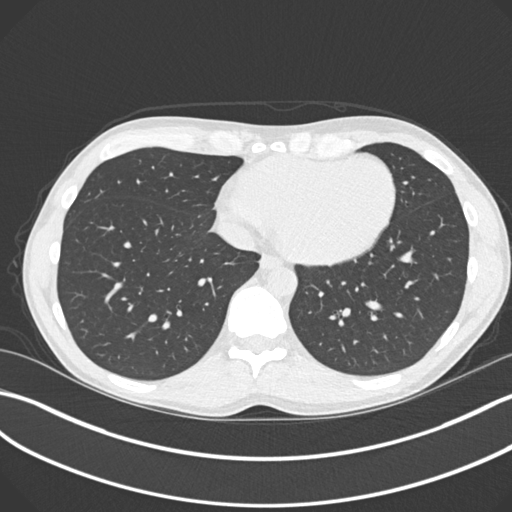
[im 62/169  mediastinal]
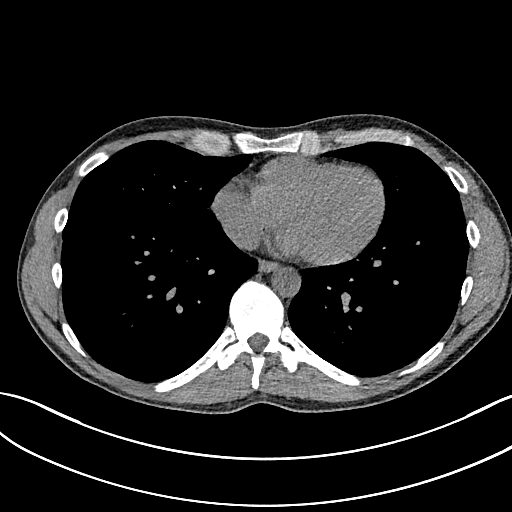
[im 62/169  lung]
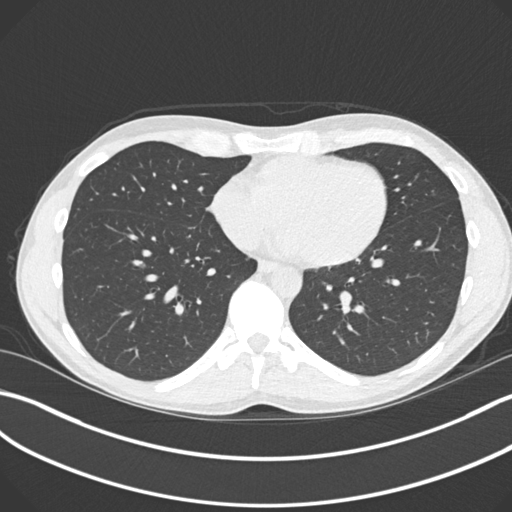
[im 77/169  lung]
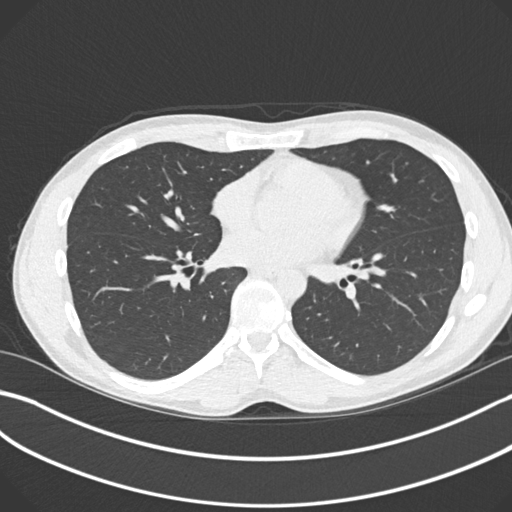
[im 92/169  lung]
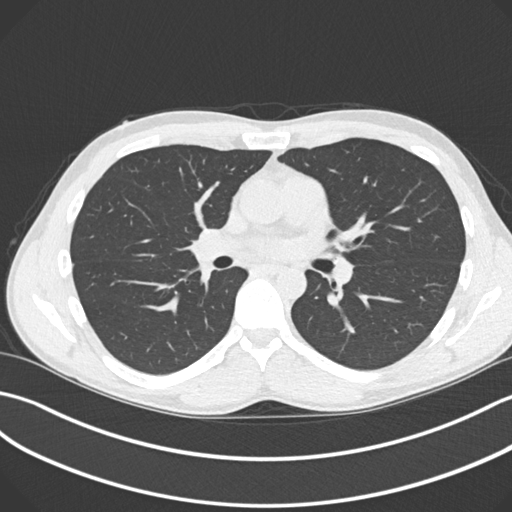
[im 107/169  lung]
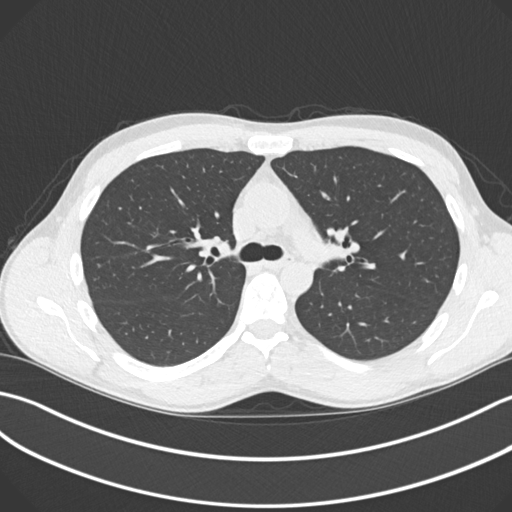
[im 115/169  mediastinal]
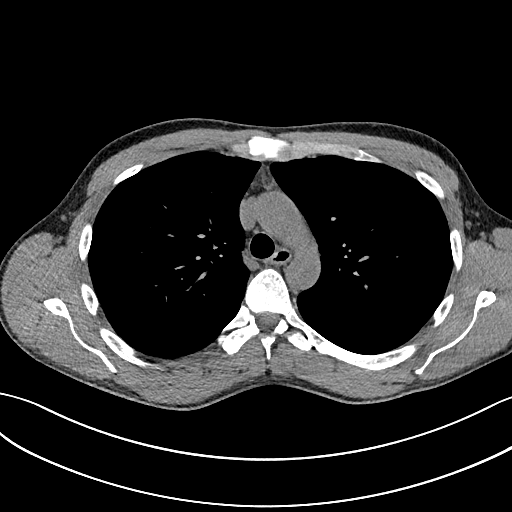
[im 115/169  lung]
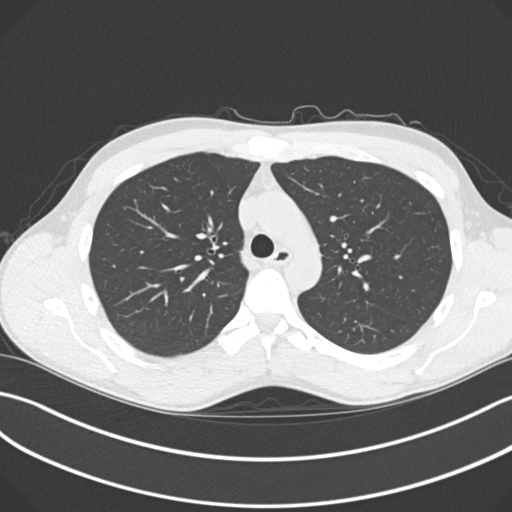
[im 130/169  lung]
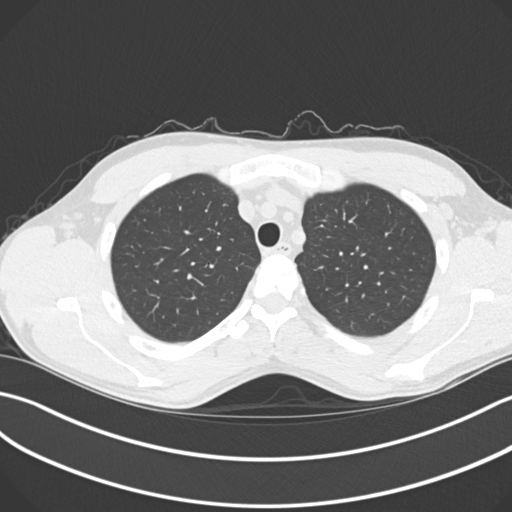
[im 146/169  lung]
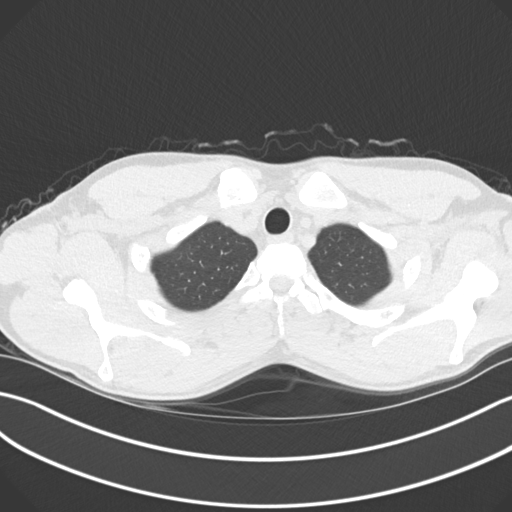
[im 161/169  lung]
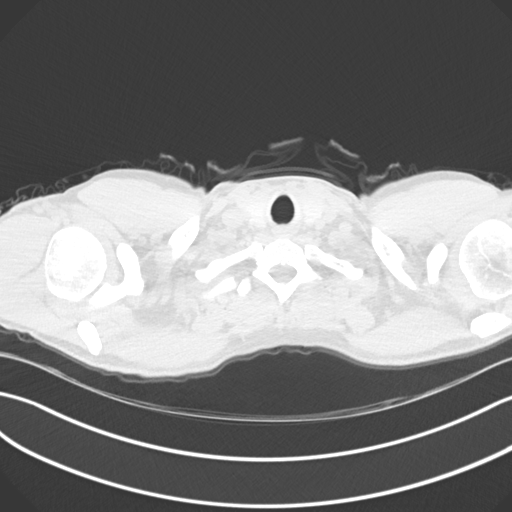

[Series 9: coronal · coronal · 0.68mm/px · 3 of 108 slices shown]
[im 22/108  lung]
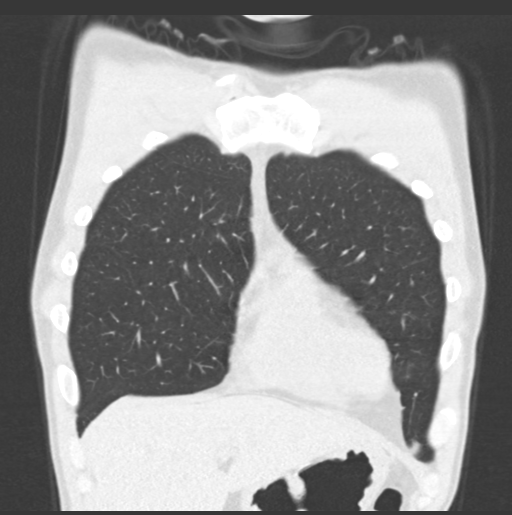
[im 43/108  lung]
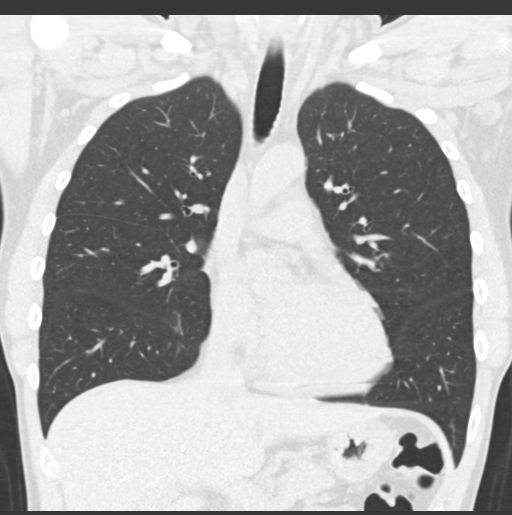
[im 65/108  lung]
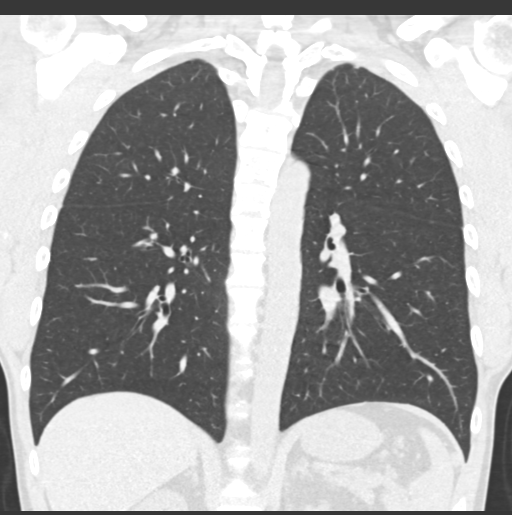

[15 of 36 positions shown; findings below may reference images not displayed]

FINDINGS: Cardiovascular: Normal heart size. No significant pericardial
effusion/thickening. Great vessels are normal in course and caliber.

Mediastinum/Nodes: No discrete thyroid nodules. Unremarkable
esophagus. No pathologically enlarged axillary, mediastinal or hilar
lymph nodes, noting limited sensitivity for the detection of hilar
adenopathy on this noncontrast study.

Lungs/Pleura: No pneumothorax. No pleural effusion. No acute
consolidative airspace disease or lung masses. Tiny 2 mm posterior
right lower lobe pulmonary nodule (series 3/image 106). No
additional significant pulmonary nodules. No significant regions of
subpleural reticulation, ground-glass attenuation, bronchiectasis,
parenchymal banding, architectural distortion or frank honeycombing.
No evidence of air trapping or tracheobronchomalacia on the
expiration sequence.

Upper abdomen: No acute abnormality.

Musculoskeletal:  No aggressive appearing focal osseous lesions.
IMPRESSION: 1. No evidence of interstitial lung disease. No significant regions
of bronchiectasis.
2. Tiny 2 mm right lower lobe pulmonary nodule. No follow-up needed
if patient is low-risk. Non-contrast chest CT can be considered in
12 months if patient is high-risk. This recommendation follows the
consensus statement: Guidelines for Management of Incidental
Pulmonary Nodules Detected on CT Images:From the [HOSPITAL]
1358; published online before print (10.1148/radiol.9015171720).

## 2022-07-15 ENCOUNTER — Other Ambulatory Visit (HOSPITAL_COMMUNITY): Payer: Self-pay | Admitting: Family Medicine

## 2022-07-15 DIAGNOSIS — E7800 Pure hypercholesterolemia, unspecified: Secondary | ICD-10-CM

## 2022-08-07 ENCOUNTER — Ambulatory Visit (HOSPITAL_COMMUNITY)
Admission: RE | Admit: 2022-08-07 | Discharge: 2022-08-07 | Disposition: A | Discharge status: Home / Self Care | Payer: 59 | Source: Ambulatory Visit | Attending: Family Medicine | Admitting: Family Medicine

## 2022-08-07 DIAGNOSIS — E7800 Pure hypercholesterolemia, unspecified: Secondary | ICD-10-CM | POA: Insufficient documentation

## 2023-09-07 ENCOUNTER — Emergency Department (HOSPITAL_COMMUNITY)
Admission: EM | Admit: 2023-09-07 | Discharge: 2023-09-07 | Disposition: A | Discharge status: Home / Self Care | Attending: Emergency Medicine | Admitting: Emergency Medicine

## 2023-09-07 DIAGNOSIS — S91012A Laceration without foreign body, left ankle, initial encounter: Secondary | ICD-10-CM | POA: Diagnosis present

## 2023-09-07 DIAGNOSIS — W268XXA Contact with other sharp object(s), not elsewhere classified, initial encounter: Secondary | ICD-10-CM | POA: Insufficient documentation

## 2023-09-07 DIAGNOSIS — Z23 Encounter for immunization: Secondary | ICD-10-CM | POA: Insufficient documentation

## 2023-09-07 MED ORDER — BACITRACIN ZINC 500 UNIT/GM EX OINT
TOPICAL_OINTMENT | Freq: Two times a day (BID) | CUTANEOUS | Status: DC
  Administered 2023-09-07: 1 via TOPICAL
  Filled 2023-09-07: qty 0.9

## 2023-09-07 MED ORDER — CEPHALEXIN 500 MG PO CAPS: 500.0000 mg | ORAL_CAPSULE | Freq: Four times a day (QID) | ORAL | 0 refills | Status: AC

## 2023-09-07 MED ORDER — TETANUS-DIPHTH-ACELL PERTUSSIS 5-2.5-18.5 LF-MCG/0.5 IM SUSY
0.5000 mL | PREFILLED_SYRINGE | Freq: Once | INTRAMUSCULAR | Status: AC
  Administered 2023-09-07: 0.5 mL via INTRAMUSCULAR
  Filled 2023-09-07: qty 0.5

## 2023-09-07 NOTE — ED Notes (Signed)
 Med and tx orders with d/c order, delay in d/c

## 2023-09-07 NOTE — ED Provider Notes (Signed)
 [place] EMERGENCY DEPARTMENT AT [facility] Provider Note   CSN: [id number] Arrival date & time: 09/07/23  9379     Patient presents with: Laceration   Ralph Tapia is a 38 y.o. male.   Patient with history of psoriasis on immunomodulating therapy --presents to the emergency department for evaluation of medial left ankle laceration.  Wound occurred around 1 PM yesterday.  Patient cleaned the area with peroxide.  No other treatment.  Here today for tetanus booster.  He has had associated soreness.  He is ambulatory but hurts to move and walk.  No erythema, swelling, drainage.  No fevers.       Prior to Admission medications   Medication Sig Start Date End Date Taking? Authorizing Provider  amLODipine  (NORVASC ) 10 MG tablet Take 1 tablet (10 mg total) by mouth daily. 07/19/17 10/11/18  Melvenia Ronal NOVAK, PA-C  bacitracin  ointment Apply 1 application topically 2 (two) times daily. 01/19/21   Rancour, Garnette, MD  carvedilol  (COREG ) 3.125 MG tablet TAKE 1 TABLET (3.125 MG TOTAL) BY MOUTH 2 (TWO) TIMES DAILY. 04/11/20   Dann Candyce RAMAN, MD  cephALEXin  (KEFLEX ) 500 MG capsule Take 1 capsule (500 mg total) by mouth 3 (three) times daily. 01/19/21   Rancour, Garnette, MD  clobetasol cream (TEMOVATE) 0.05 % Apply 1 application topically 2 (two) times daily. 11/03/18   [provider]  diclofenac  Sodium (VOLTAREN ) 1 % GEL Apply 4 g topically 4 (four) times daily. 10/26/20   Arloa Suzen RAMAN, NP  escitalopram (LEXAPRO) 10 MG tablet Take 10 mg by mouth daily.    [provider]  fluticasone (CUTIVATE) 0.05 % cream  05/04/18   [provider]  HUMIRA PEN 40 MG/0.8ML PNKT Inject 1 Syringe into the skin every 14 (fourteen) days. 12/22/18   [provider]  ketoconazole (NIZORAL) 2 % cream Apply  as directed to affected area twice a day as needed For itch/flare 11/03/18   [provider]  predniSONE  (DELTASONE ) 20 MG tablet Take 2 tablets (40 mg  total) by mouth daily. 11/16/20   Rolinda Redell, MD    Allergies: Naproxen     Review of Systems  Updated Vital Signs BP (!) 158/103 (BP Location: Left Arm)   Pulse 68   Temp 98.8 F (37.1 C) (Oral)   Resp 18   SpO2 99%   Physical Exam Vitals and nursing note reviewed.  Constitutional:      Appearance: He is well-developed.  HENT:     Head: Normocephalic and atraumatic.  Eyes:     Conjunctiva/sclera: Conjunctivae normal.  Cardiovascular:     Pulses:          Radial pulses are 2+ on the left side.  Pulmonary:     Effort: No respiratory distress.  Musculoskeletal:     Cervical back: Normal range of motion and neck supple.     Left ankle: Tenderness present. Normal range of motion.       Legs:     Comments: Left medial ankle: Laceration/abrasion noted.  Deepest portion of the laceration, approximately 1 cm in length.  No obvious contamination.  Distally to this area, is more of an abrasion.  Skin:    General: Skin is warm and dry.  Neurological:     Mental Status: He is alert.     (all labs ordered are listed, but only abnormal results are displayed) Labs Reviewed - No data to display  EKG: None  Radiology: No results found.   Procedures  Medications Ordered in the ED  bacitracin  ointment (has no administration in time range)  Tdap (BOOSTRIX) injection 0.5 mL (has no administration in time range)   ED Course  Patient seen and examined. History obtained directly from patient.   Labs/EKG: None ordered  Imaging: Discussed imaging of the ankle, however low concern for fracture, low concern for retained foreign body.  After discussion with patient, agrees to defer.  Medications/Fluids: Ordered: Tdap  Most recent vital signs reviewed and are as follows: BP (!) 158/103 (BP Location: Left Arm)   Pulse 68   Temp 98.8 F (37.1 C) (Oral)   Resp 18   SpO2 99%   Initial impression: Left medial ankle laceration/abrasion  Home treatment plan: P.o. Keflex  x 5  days due to possible wound contamination and with immune modulating therapy due to psoriasis  Return instructions discussed with patient: Pt urged to return with worsening pain, worsening swelling, expanding area of redness or streaking up extremity, fever, or any other concerns.   Follow-up instructions discussed with patient: F/u with PCP as needed.                                  Medical Decision Making Risk OTC drugs. Prescription drug management.   Patient with left ankle laceration, this occurred greater than 12 hours ago.  This increases infection risk.  Overall, wound is fairly clean appearing.  Would have likely received a couple of sutures that he come in earlier, however wound is relatively well-approximated.  Will give course of Keflex .  Low concern for fracture, retained foreign body.      Final diagnoses:  Laceration of left ankle, initial encounter    ED Discharge Orders          Ordered    cephALEXin  (KEFLEX ) 500 MG capsule  4 times daily        09/07/23 0722               Desiderio Chew, PA-C 09/07/23 0723    Elnor Jayson LABOR, DO 09/13/23 [phone]

## 2023-09-07 NOTE — ED Triage Notes (Signed)
 Patient arrived stating he cut his ankle yesterday working outside around 1pm. Bleeding controlled.

## 2023-09-07 NOTE — Discharge Instructions (Signed)
 Please read and follow all provided instructions.  Your diagnoses today include:  1. Laceration of left ankle, initial encounter    Tests performed today include: Vital signs. See below for your results today.   Medications prescribed:  Keflex  (cephalexin ) - antibiotic  You have been prescribed an antibiotic medicine: take the entire course of medicine even if you are feeling better. Stopping early can cause the antibiotic not to work.  Please use over-the-counter NSAID medications (ibuprofen, naproxen ) or Tylenol  (acetaminophen ) as directed on the packaging for pain -- as long as you do not have any reasons avoid these medications. Reasons to avoid NSAID medications include: weak kidneys, a history of bleeding in your stomach or gut, or uncontrolled high blood pressure or previous heart attack. Reasons to avoid Tylenol  include: liver problems or ongoing alcohol use. Never take more than 4000mg  or 8 Extra strength Tylenol  in a 24 hour period.     Take any prescribed medications only as directed.   Home care instructions:  Follow any educational materials and wound care instructions contained in this packet.   Keep affected area above the level of your heart when possible to minimize swelling. Wash area gently twice a day with warm soapy water. Do not apply alcohol or hydrogen peroxide. Cover the area if it draining or weeping.    Return instructions:  Return to the Emergency Department if you have: Fever Worsening pain Worsening swelling of the wound Pus draining from the wound Redness of the skin that moves away from the wound, especially if it streaks away from the affected area  Any other emergent concerns  Your vital signs today were: BP (!) 158/103 (BP Location: Left Arm)   Pulse 68   Temp 98.8 F (37.1 C) (Oral)   Resp 18   SpO2 99%  If your blood pressure (BP) was elevated above 135/85 this visit, please have this repeated by your doctor within one  month. --------------
